# Patient Record
Sex: Female | Born: 1995 | Race: White | Hispanic: No | Marital: Single | Smoking: Never smoker
Health system: Southern US, Community
[De-identification: ages and names within clinical notes are randomized; demographics above are authoritative.]

## PROBLEM LIST (undated history)

## (undated) DIAGNOSIS — E282 Polycystic ovarian syndrome: Secondary | ICD-10-CM

## (undated) DIAGNOSIS — J302 Other seasonal allergic rhinitis: Secondary | ICD-10-CM

## (undated) DIAGNOSIS — I959 Hypotension, unspecified: Secondary | ICD-10-CM

## (undated) DIAGNOSIS — L309 Dermatitis, unspecified: Secondary | ICD-10-CM

## (undated) DIAGNOSIS — J342 Deviated nasal septum: Secondary | ICD-10-CM

## (undated) DIAGNOSIS — R112 Nausea with vomiting, unspecified: Secondary | ICD-10-CM

## (undated) DIAGNOSIS — Z8489 Family history of other specified conditions: Secondary | ICD-10-CM

## (undated) DIAGNOSIS — D899 Disorder involving the immune mechanism, unspecified: Secondary | ICD-10-CM

## (undated) DIAGNOSIS — B2790 Infectious mononucleosis, unspecified without complication: Secondary | ICD-10-CM

## (undated) DIAGNOSIS — F419 Anxiety disorder, unspecified: Secondary | ICD-10-CM

## (undated) DIAGNOSIS — R7303 Prediabetes: Secondary | ICD-10-CM

## (undated) DIAGNOSIS — I7300 Raynaud's syndrome without gangrene: Secondary | ICD-10-CM

## (undated) DIAGNOSIS — Z9889 Other specified postprocedural states: Secondary | ICD-10-CM

## (undated) HISTORY — DX: Infectious mononucleosis, unspecified without complication: B27.90

## (undated) HISTORY — DX: Other seasonal allergic rhinitis: J30.2

## (undated) HISTORY — DX: Polycystic ovarian syndrome: E28.2

---
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
## 2017-01-26 DIAGNOSIS — E162 Hypoglycemia, unspecified: Secondary | ICD-10-CM | POA: Diagnosis not present

## 2017-01-27 DIAGNOSIS — H1045 Other chronic allergic conjunctivitis: Secondary | ICD-10-CM | POA: Diagnosis not present

## 2017-01-27 DIAGNOSIS — L2089 Other atopic dermatitis: Secondary | ICD-10-CM | POA: Diagnosis not present

## 2017-01-27 DIAGNOSIS — J3089 Other allergic rhinitis: Secondary | ICD-10-CM | POA: Diagnosis not present

## 2017-01-27 DIAGNOSIS — J301 Allergic rhinitis due to pollen: Secondary | ICD-10-CM | POA: Diagnosis not present

## 2017-04-18 DIAGNOSIS — G43009 Migraine without aura, not intractable, without status migrainosus: Secondary | ICD-10-CM | POA: Diagnosis not present

## 2017-04-18 DIAGNOSIS — J3089 Other allergic rhinitis: Secondary | ICD-10-CM | POA: Insufficient documentation

## 2017-04-18 DIAGNOSIS — J310 Chronic rhinitis: Secondary | ICD-10-CM | POA: Diagnosis not present

## 2017-04-20 DIAGNOSIS — J329 Chronic sinusitis, unspecified: Secondary | ICD-10-CM | POA: Diagnosis not present

## 2017-04-20 DIAGNOSIS — J3089 Other allergic rhinitis: Secondary | ICD-10-CM | POA: Diagnosis not present

## 2017-04-20 DIAGNOSIS — G43009 Migraine without aura, not intractable, without status migrainosus: Secondary | ICD-10-CM | POA: Diagnosis not present

## 2017-04-20 DIAGNOSIS — Z0100 Encounter for examination of eyes and vision without abnormal findings: Secondary | ICD-10-CM | POA: Diagnosis not present

## 2017-06-20 DIAGNOSIS — J324 Chronic pansinusitis: Secondary | ICD-10-CM | POA: Diagnosis not present

## 2017-07-08 DIAGNOSIS — S0502XA Injury of conjunctiva and corneal abrasion without foreign body, left eye, initial encounter: Secondary | ICD-10-CM | POA: Diagnosis not present

## 2017-07-26 ENCOUNTER — Encounter: Payer: Self-pay | Admitting: Physician Assistant

## 2017-07-26 ENCOUNTER — Ambulatory Visit (INDEPENDENT_AMBULATORY_CARE_PROVIDER_SITE_OTHER): Payer: BLUE CROSS/BLUE SHIELD | Admitting: Physician Assistant

## 2017-07-26 VITALS — BP 90/62 | HR 96 | Ht 68.0 in | Wt 125.0 lb

## 2017-07-26 DIAGNOSIS — R1084 Generalized abdominal pain: Secondary | ICD-10-CM

## 2017-07-26 DIAGNOSIS — K589 Irritable bowel syndrome without diarrhea: Secondary | ICD-10-CM | POA: Diagnosis not present

## 2017-07-26 DIAGNOSIS — R194 Change in bowel habit: Secondary | ICD-10-CM

## 2017-07-26 NOTE — Progress Notes (Addendum)
Chief Complaint: Change in bowel habits, "loud swallowing", abdominal pain  HPI:    Terry Harmon is a 22 year old Caucasian female with a past medical history as listed below including PCOS, who was assigned to Dr. Leone Payor at last visit, who presents to clinic today with a complaint of change in bowel habits, "loud swallowing", and abdominal pain.    04/01/2016 office visit with me to discuss the bloating and reflux.  At that time IBS was discussed, H. pylori fecal antigen was ordered and patient was told to stay on her lansoprazole 15 mg daily.  H. pylori fecal antigen was negative.    Today, patient presents alone and describes that in November she came down with a sinus infection and was placed on antibiotics, this never went away and she was placed on more antibiotics in December.  Was using Doxycycline which was making her vomit or have diarrhea or both.  Continued with a sinus infection and was placed on a third antibiotic, "I think Augmentin" for about a month..  This did help her sinus infection and she was found to have a deviated septum which is going to be surgically fixed later.      Since December the patient has had irregular bowel movements describing more frequent stools, "my normal is 2 to 3 days apart", as well as a change in color to orange/yellow and often with mucus.  2 weeks ago the patient started a probiotic called Zenwyse and has had normal stools since then.  She questions what to do going forward.    Also describes that she thinks her lactose intolerance is getting better.  She is able to handle larger amounts of dairy with no problems.  Asked if there is any way to "wean herself off of Lactaid".    Describes "loud swallowing".  Apparently her family laugh at her when she is drinking as they can hear it in various rooms of the house.  She has always done this her whole life but her parents wanted to make sure this was okay.    Also discusses that she holds her anxiety and stress  in her abdomen at times will become tense in her stomach when this occurs and wonders if there are any stretches for this.    Social history positive for going to [employer].  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    Denies fever, chills, blood in her stool, melena, weight loss, anorexia or symptoms that awaken her at night.  Past Medical History:  Diagnosis Date  . 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 Mononucleosis    Age 64  . PCOS (polycystic ovarian syndrome)   . Seasonal allergies     Past Surgical History:  Procedure Laterality Date  . WISDOM TOOTH EXTRACTION  2018    Current Outpatient Medications  Medication Sig Dispense Refill  . cetirizine (ZYRTEC) 10 MG tablet Take 10 mg by mouth daily.    . fexofenadine (ALLEGRA) 180 MG tablet Take 180 mg by mouth daily.    . Lactobacillus (PROBIOTIC ACIDOPHILUS PO) Take 1 capsule by mouth daily.    . lansoprazole (PREVACID) 15 MG capsule Take 15 mg by mouth daily at 12 noon.    Marland Kitchen Lysine 500 MG TABS Take 1 tablet by mouth daily as needed.    . Multiple Vitamins-Minerals (WOMENS MULTIVITAMIN PLUS PO) Take 1 tablet by mouth  daily.    . Omega-3 Fatty Acids (FISH OIL) 1000 MG CAPS Take 1 capsule by mouth daily.    . vitamin E 100 UNIT capsule Take 100 Units by mouth daily.      No current facility-administered medications for this visit.     Allergies as of 07/26/2017 - Review Complete 07/26/2017  Allergen Reaction Noted  . Corn-containing products Dermatitis 02/26/2014  . Doxycycline Nausea And Vomiting 04/25/2017  . Molds & smuts Other (See Comments) 10/23/2013  . Tomato Nausea And Vomiting 10/04/2014    Family History  Problem Relation Age of Onset  . Diabetes Maternal Grandmother   . Cancer Maternal Grandmother   . Diabetes Maternal Grandfather   . Hypertension Paternal Grandmother   . Hypertension Paternal Grandfather   . Autoimmune  disease Maternal Aunt        Myastinia  . Autoimmune disease Sister     Social History   Socioeconomic History  . Marital status: Single    Spouse name: Not on file  . Number of children: Not on file  . Years of education: Not on file  . Highest education level: Not on file  Occupational History  . Not on file  Social Needs  . Financial resource strain: Not on file  . Food insecurity:    Worry: Not on file    Inability: Not on file  . Transportation needs:    Medical: Not on file    Non-medical: Not on file  Tobacco Use  . Smoking status: Never Smoker  . Smokeless tobacco: Never Used  Substance and Sexual Activity  . Alcohol use: No  . Drug use: No  . Sexual activity: Not on file  Lifestyle  . Physical activity:    Days per week: Not on file    Minutes per session: Not on file  . Stress: Not on file  Relationships  . Social connections:    Talks on phone: Not on file    Gets together: Not on file    Attends religious service: Not on file    Active member of club or organization: Not on file    Attends meetings of clubs or organizations: Not on file    Relationship status: Not on file  . Intimate partner violence:    Fear of current or ex partner: Not on file    Emotionally abused: Not on file    Physically abused: Not on file    Forced sexual activity: Not on file  Other Topics Concern  . Not on file  Social History Narrative   Lives at home with mom dad and three siblings, attends [employer] is in 10th grade.     Review of Systems:    Constitutional: No weight loss, fever or chills Cardiovascular: No chest pain Respiratory: No SOB  Gastrointestinal: See HPI and otherwise negative   Physical Exam:  Vital signs: BP 90/62   Pulse 96   Ht 5\' 8"  (1.727 m)   Wt 125 lb (56.7 kg)   LMP 07/18/2017 (Approximate)   BMI 19.01 kg/m   Constitutional:   Pleasant Caucasian female appears to be in NAD, Well developed, Well nourished, alert and  cooperative Respiratory: Respirations even and unlabored. Lungs clear to auscultation bilaterally.   No wheezes, crackles, or rhonchi.  Cardiovascular: Normal S1, S2. No MRG. Regular rate and rhythm. No peripheral edema, cyanosis or pallor.  Gastrointestinal:  Soft, nondistended, nontender. No rebound or guarding. Normal bowel sounds. No appreciable masses or hepatomegaly.  Psychiatric: Demonstrates good judgement and reason without abnormal affect or behaviors.  No recent labs or imaging.  Assessment: 1.  Change in bowel habits: Describes mucus and change in color after multiple antibiotics for sinusitis, better now over the past 2 weeks with a probiotic 2.  "Loud swallowing": Possibly aerophagia, discussed avoiding taking sips from straws and possibly the chin tuck technique to help 3.  Abdominal pain: Typically related to stress and anxiety, related to IBS  Plan: 1.  Again reviewed IBS.  At this time recommend she continue her probiotic for another 6 to 8 weeks.  Discussed that she should be able to stop this then.  If she continues with change in bowel habits after that time she can continue it indefinitely if needed. 2.  Discussed the chin tuck technique when she is taking sips of water to see if this helps at all with her loud swallowing.  Reassured her that there is no cause for concern over this symptom. 3.  Explained that the patient may want to find a way to decrease stress and anxiety in her life such as yoga or another soothing activity/outlet for the stress she holds in her abdomen. 4.  Explained to the patient that she could try to gradually increase amounts of lactose she is consuming at meals to see what she is able to handle.  Otherwise she needs to continue Lactaid if she knows she is going to eat too much dairy. 5.  Patient to follow in clinic with me or Dr. Leone Payor as needed in the future.  Hyacinth Meeker, PA-C [facility] 07/26/2017, 2:28 PM  Cc: Ronney Asters, MD   Agree with Terry Harmon's evaluation and management.  Iva Boop, MD, [facility]

## 2017-07-26 NOTE — Patient Instructions (Signed)
If you are age 20 or older, your body mass index should be between 23-30. Your Body mass index is 19.01 kg/m. If this is out of the aforementioned range listed, please consider follow up with your Primary Care Provider.  If you are age 20 or younger, your body mass index should be between 19-25. Your Body mass index is 19.01 kg/m. If this is out of the aformentioned range listed, please consider follow up with your Primary Care Provider.   Continue Pre/probiotic for 2 months total.  Try chin-tuck to decrease swallow noise.  Try slowly increasing the amount of dairy you are eating at one time.  Terry Harmon with college! 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  Thank you for choosing me and [facility].  Hyacinth MeekerJennifer Lemmon, PA-C

## 2017-08-04 IMAGING — US US PELVIS COMPLETE
1 series · 14 of 25 positions shown · non-contrast
Comparison: None.

CLINICAL DATA: Menstrual irregularity. Polycystic ovarian syndrome.

EXAM:
TRANSABDOMINAL ULTRASOUND OF PELVIS
TECHNIQUE: Transabdominal ultrasound examination of the pelvis was performed
including evaluation of the uterus, ovaries, adnexal regions, and
pelvic cul-de-sac.

[Series 1: us pelvis complete · 0.17mm/px · 14 of 31 slices shown]
[im 1/31]
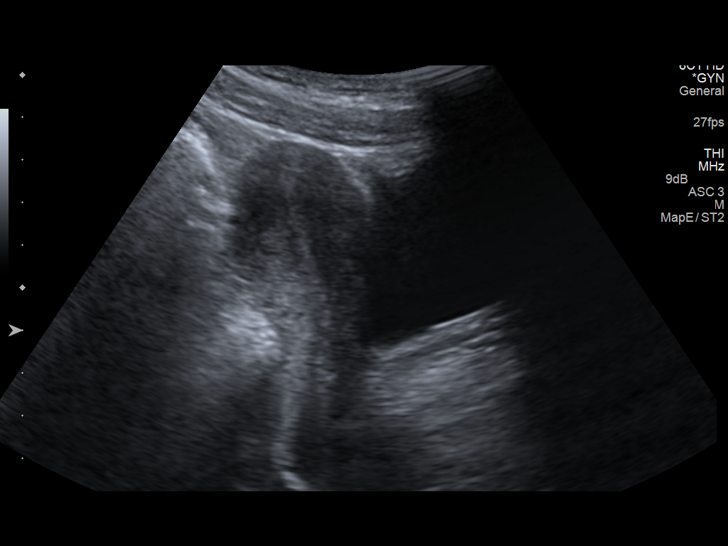
[im 3/31]
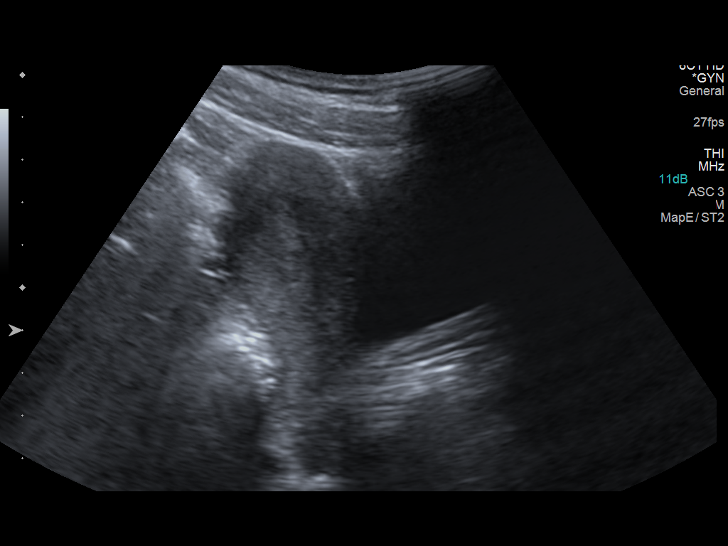
[im 6/31]
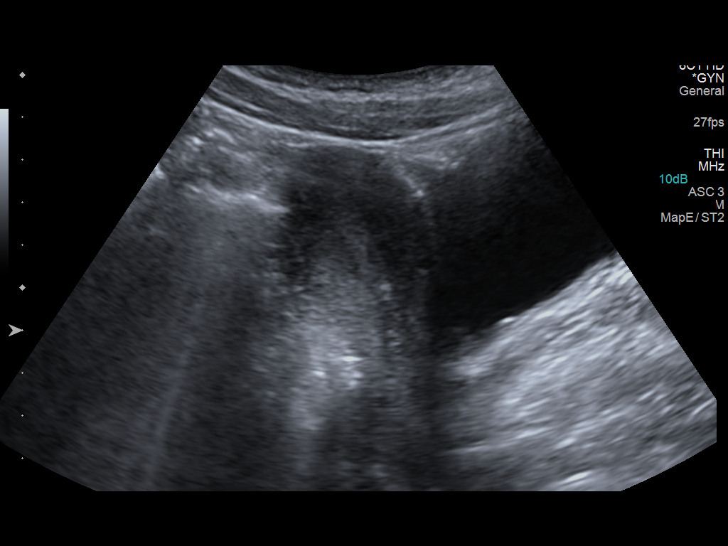
[im 8/31]
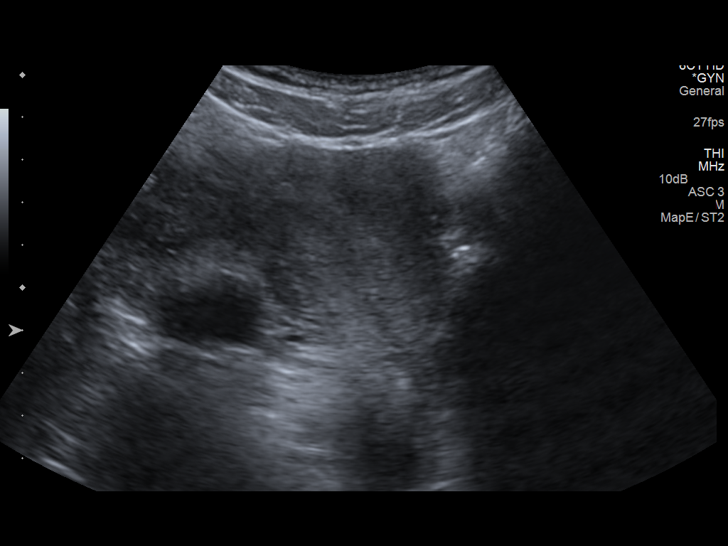
[im 11/31]
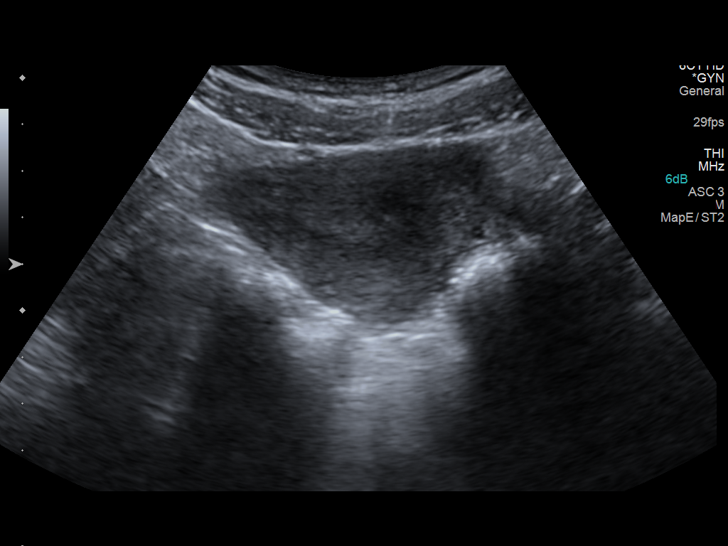
[im 12/31]
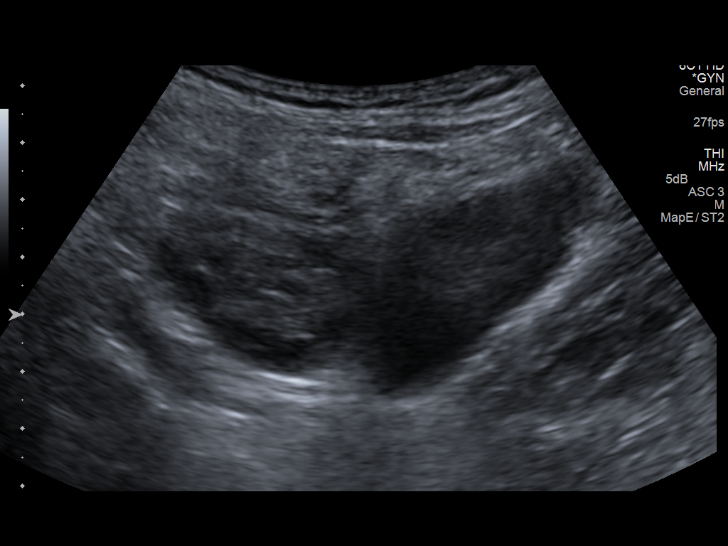
[im 14/31]
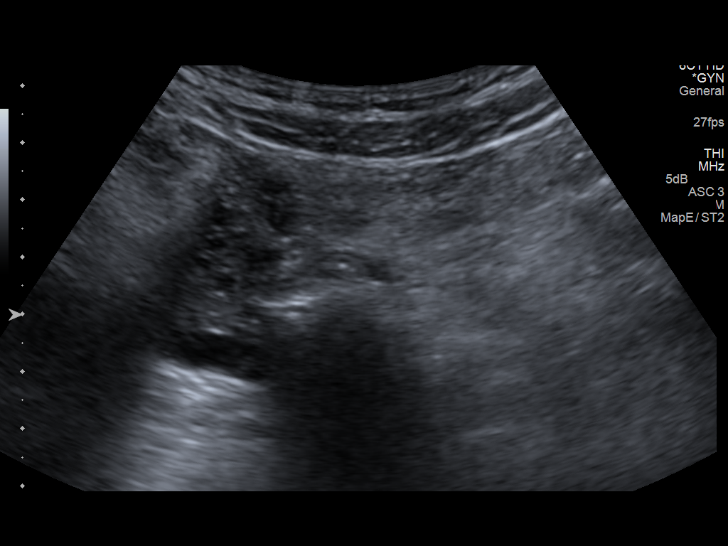
[im 17/31]
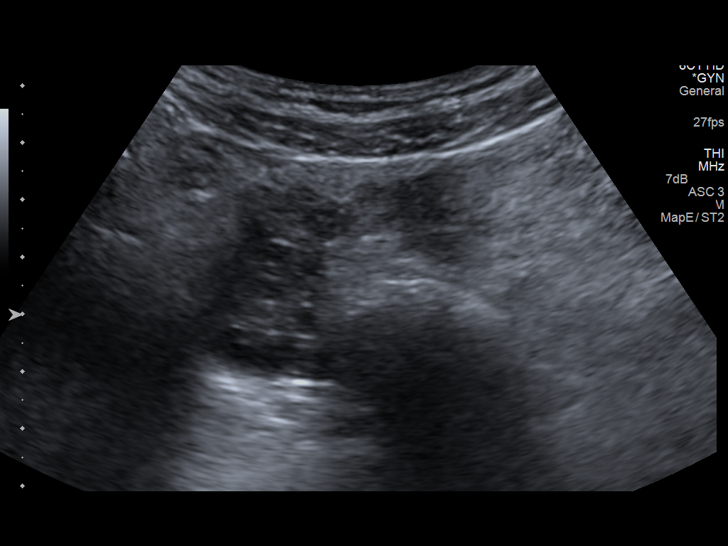
[im 19/31]
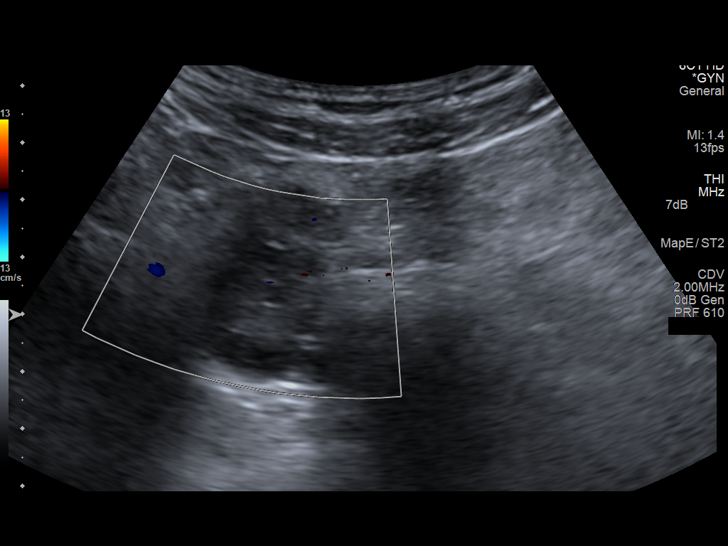
[im 21/31]
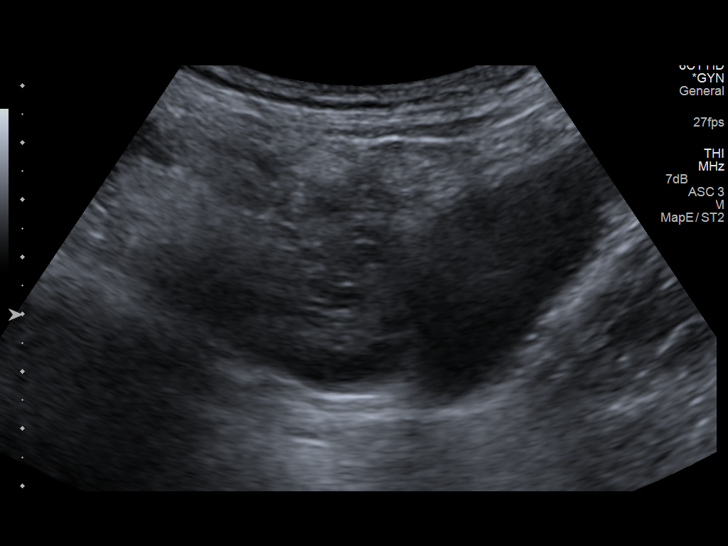
[im 23/31]
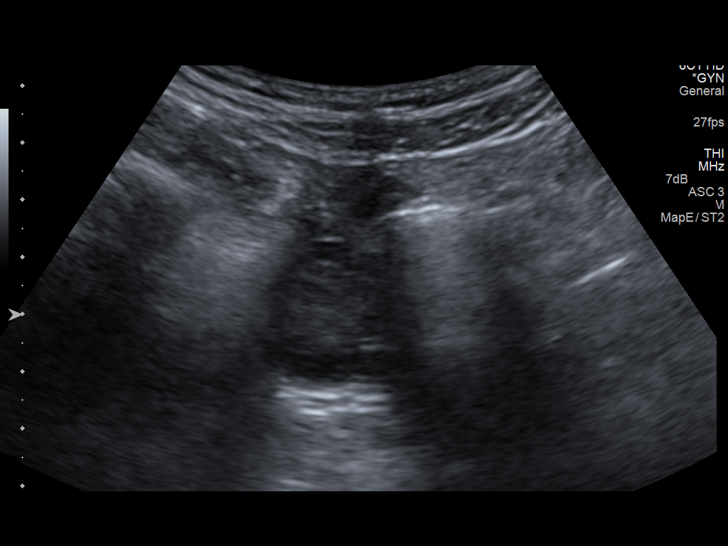
[im 26/31]
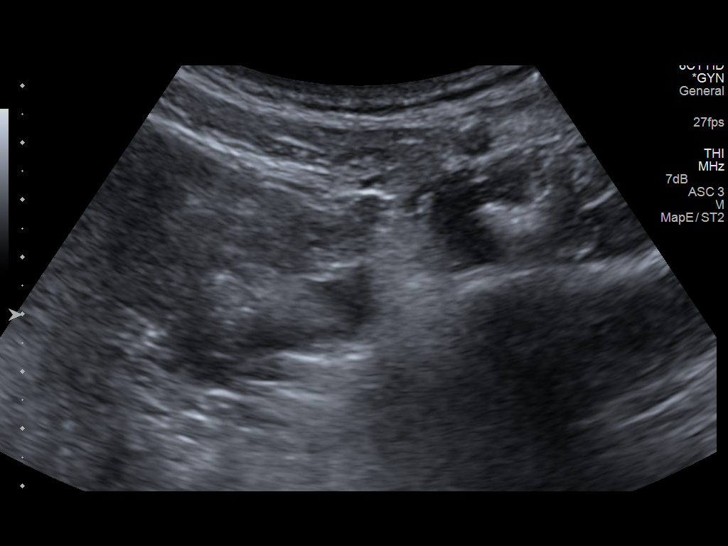
[im 28/31]
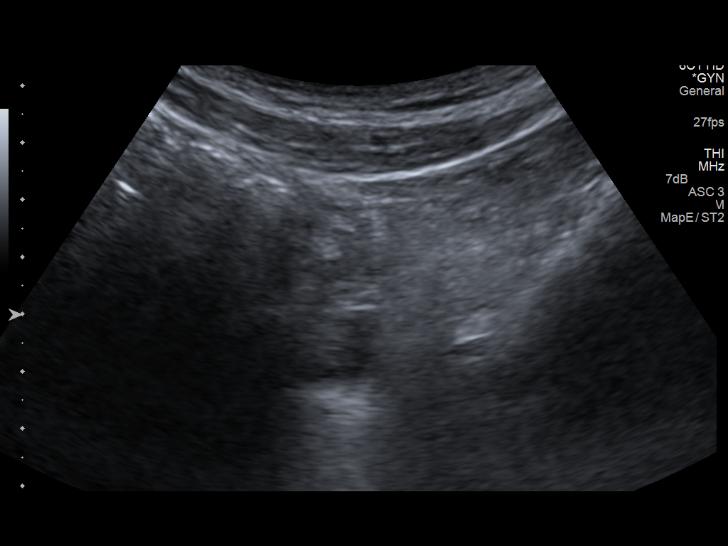
[im 31/31]
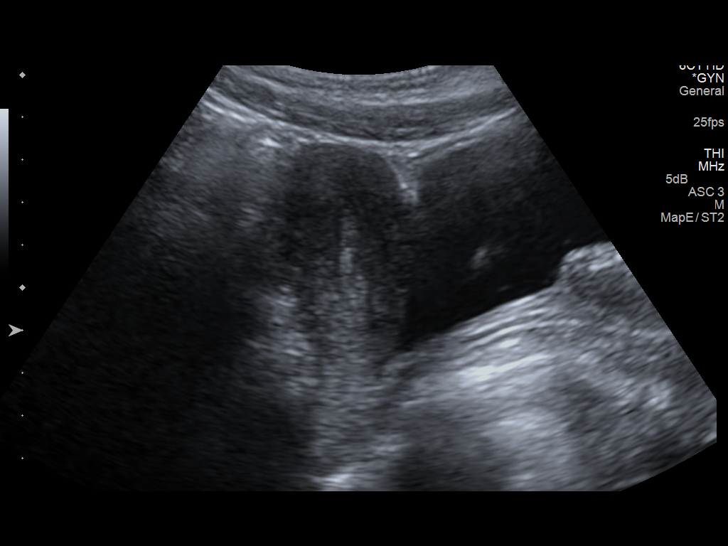

[14 of 25 positions shown; findings below may reference images not displayed]

FINDINGS: Uterus

Measurements: 8.6 x 3.4 x 5.0 cm, within normal limits. No fibroids
or other mass visualized.

Endometrium

Thickness: 8.4 mm, within normal limits. No focal abnormality
visualized.

Right ovary

Measurements: 3.8 x 2.1 x 2.0 cm, within normal limits. Normal
appearance/no adnexal mass.

Left ovary

The left ovary is not discretely visualized. No adnexal mass is
present.

Other findings:  No free fluid
IMPRESSION: 1. Normal appearance of the uterus.
2. Normal appearance of the right ovary without evidence for
significant cystic change.
3. The left ovary is not discretely visualized.

## 2017-08-10 DIAGNOSIS — F4323 Adjustment disorder with mixed anxiety and depressed mood: Secondary | ICD-10-CM | POA: Diagnosis not present

## 2017-08-14 ENCOUNTER — Encounter: Payer: Self-pay | Admitting: Physician Assistant

## 2017-08-22 DIAGNOSIS — E282 Polycystic ovarian syndrome: Secondary | ICD-10-CM | POA: Diagnosis not present

## 2017-08-22 DIAGNOSIS — E162 Hypoglycemia, unspecified: Secondary | ICD-10-CM | POA: Diagnosis not present

## 2017-08-23 ENCOUNTER — Ambulatory Visit (INDEPENDENT_AMBULATORY_CARE_PROVIDER_SITE_OTHER): Payer: BLUE CROSS/BLUE SHIELD | Admitting: Orthopaedic Surgery

## 2017-08-23 ENCOUNTER — Encounter (INDEPENDENT_AMBULATORY_CARE_PROVIDER_SITE_OTHER): Payer: Self-pay | Admitting: Orthopaedic Surgery

## 2017-08-23 VITALS — Ht 69.0 in | Wt 125.0 lb

## 2017-08-23 DIAGNOSIS — M25551 Pain in right hip: Secondary | ICD-10-CM | POA: Diagnosis not present

## 2017-08-23 DIAGNOSIS — M25552 Pain in left hip: Secondary | ICD-10-CM | POA: Diagnosis not present

## 2017-08-23 NOTE — Progress Notes (Addendum)
Office Visit Note   Patient: Terry Harmon           Date of Birth: Jan 18, 1996           MRN: [id number] Visit Date: 08/23/2017              Requested by: Terry AstersSummer, [name], MD [address] [place], [place] [id number] PCP: Terry AstersSummer, [name], MD   Assessment & Plan: Visit Diagnoses:  1. 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  Beighton Horan scale is 7/9.  At this point, we will send the patient to formal physical therapy for this.  A prescription was given to her today.  She will follow-up with us as needed.    Follow-Up Instructions: Return if symptoms worsen or fail to improve.   Orders:  No orders of the defined types were placed in this encounter.  No orders of the defined types were placed in this encounter.     Procedures: No procedures performed   Clinical Data: No additional findings.   Subjective: Chief Complaint  Patient presents with  . Left Hip - Pain  . Right Hip - Pain    HPI patient is a pleasant [age] year old female who presents to our clinic today with bilateral hip pain.  This is been ongoing for the past several years without any known injury or change in activity.  It has progressively worsened over the past year she has been away at school and it has been participating in provoking activities such as frequently crossing her legs while externally rotating the hips.  She also notes subluxation of both hips.  She was recently seen by chiropractor where she was diagnosed with a left psoas strain.  Both hips were adjusted which has somewhat improved her symptoms.  She has not tried formal physical therapy.  Of note, she is hypermobile.  She has recently had x-rays done by her chiropractor.  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  Review of Systems as detailed in HPI.  All others reviewed and are  negative.   Objective: Vital Signs: Ht 5\' 9"  (1.753 m)   Wt 125 lb (56.7 kg)   BMI 18.46 kg/m   Physical Exam well-developed and well-nourished female in no acute distress.  Alert and oriented x3.  Ortho Exam examination of both hips reveals increased mobility with internal rotation of both hips.  This does elicit pain on the right more so than the left.  Negative Faber.  Full motion and strength with hip flexion, abduction and abduction.  She does have increased pain with resisted hip flexion.  She has a Beight score of 7/9.  Specialty Comments:  No specialty comments available.  Imaging: No new imaging   PMFS History: Patient Active Problem List   Diagnosis Date Noted  . Pain of both hip joints 08/23/2017  . Abnormal blood sugar 11/29/2014  . Irregular menstrual cycle 02/26/2014  . Multiple environmental allergies 11/04/2013  . ECZEMA, ATOPIC DERMATITIS 04/07/2006   Past Medical History:  Diagnosis Date  . 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 Mononucleosis    Age 22  . PCOS (polycystic ovarian syndrome)   . Seasonal allergies     Family History  Problem Relation Age of Onset  . Diabetes Maternal Grandmother   . Cancer Maternal Grandmother   .  Diabetes Maternal Grandfather   . Hypertension Paternal Grandmother   . Hypertension Paternal Grandfather   . Autoimmune disease Maternal Aunt        Myastinia  . Autoimmune disease Sister     Past Surgical History:  Procedure Laterality Date  . WISDOM TOOTH EXTRACTION  2018   Social History   Occupational History  . Not on file  Tobacco Use  . Smoking status: Never Smoker  . Smokeless tobacco: Never Used  Substance and Sexual Activity  . Alcohol use: No  . Drug use: No  . Sexual activity: Not on file

## 2017-08-24 DIAGNOSIS — F4323 Adjustment disorder with mixed anxiety and depressed mood: Secondary | ICD-10-CM | POA: Diagnosis not present

## 2017-08-25 DIAGNOSIS — I959 Hypotension, unspecified: Secondary | ICD-10-CM | POA: Diagnosis not present

## 2017-08-25 DIAGNOSIS — E162 Hypoglycemia, unspecified: Secondary | ICD-10-CM | POA: Diagnosis not present

## 2017-08-25 DIAGNOSIS — E282 Polycystic ovarian syndrome: Secondary | ICD-10-CM | POA: Diagnosis not present

## 2017-08-31 ENCOUNTER — Ambulatory Visit: Payer: BLUE CROSS/BLUE SHIELD | Attending: Orthopaedic Surgery | Admitting: Physical Therapy

## 2017-08-31 ENCOUNTER — Other Ambulatory Visit: Payer: Self-pay

## 2017-08-31 ENCOUNTER — Encounter: Payer: Self-pay | Admitting: Physical Therapy

## 2017-08-31 DIAGNOSIS — M25551 Pain in right hip: Secondary | ICD-10-CM | POA: Insufficient documentation

## 2017-08-31 DIAGNOSIS — M6281 Muscle weakness (generalized): Secondary | ICD-10-CM

## 2017-08-31 DIAGNOSIS — M25552 Pain in left hip: Secondary | ICD-10-CM | POA: Diagnosis not present

## 2017-08-31 DIAGNOSIS — F4323 Adjustment disorder with mixed anxiety and depressed mood: Secondary | ICD-10-CM | POA: Diagnosis not present

## 2017-08-31 DIAGNOSIS — R262 Difficulty in walking, not elsewhere classified: Secondary | ICD-10-CM | POA: Diagnosis not present

## 2017-08-31 NOTE — Therapy (Signed)
[facility] [address] [place], [place], [address] Phone: [phone]   Fax:  [phone]  Physical Therapy Evaluation  Patient Details  Name: Terry Harmon MRN: [id number] Date of Birth: [birth year] Referring Provider: Gershon Mussel, MD   Encounter Date: 08/31/2017  PT End of Session - 08/31/17 1427    Visit Number  1    Number of Visits  12    PT Start Time  1145    PT Stop Time  1232    PT Time Calculation (min)  47 min    Activity Tolerance  Patient tolerated treatment well    Behavior During Therapy  [facility] for tasks assessed/performed       Past Medical History:  Diagnosis Date  . 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 Mononucleosis    Age 22  . PCOS (polycystic ovarian syndrome)   . Seasonal allergies     Past Surgical History:  Procedure Laterality Date  . WISDOM TOOTH EXTRACTION  2018    There were no vitals filed for this visit.   Subjective Assessment - 08/31/17 1155    Subjective  Pt arriving to therapy with bilateral hip pain. Pt reporting bilateral "popping" where she thinks is when her hips are subluxing. Pt reporting the R is worse than left. Pt reporting having PT when she was in middle school. Pt reporting having bilateral orthotics to support her arches to prevent pronation/dropped arch. Pt also reported that since she began college and stopped playing sports regularly her pain has worsened.     Pertinent History  Snapping hip syndrome L, extra lumbar vertebrae    Limitations  Sitting    How long can you sit comfortably?  unable to sit comfortably, pt reports having to constantly shift her weight. Pt reports sitting with knee and hips flexed is more comfortable.    How long can you stand comfortably?  1-2 hours    How long can you walk comfortably?  unlimited    Diagnostic tests  X-rays, 2018    Patient Stated Goals  Stop hurting, establish a good exercise program  to prevent flare ups    Currently in Pain?  Yes    Pain Score  2     Pain Location  Hip    Pain Orientation  Right;Left    Pain Descriptors / Indicators  Aching;Discomfort;Sore    Pain Type  Chronic pain    Pain Radiating Towards  lower back    Pain Onset  More than a month ago    Pain Frequency  Constant    Aggravating Factors   certain movements    Pain Relieving Factors  going to chiropractor         [facility] PT Assessment - 08/31/17 0001      Assessment   Medical Diagnosis  bilateral hip pain, ligament laxity history of hip subluxation    Referring Provider  Gershon Mussel, MD    Hand Dominance  Right    Prior Therapy  no      Precautions   Precautions  None    Required Braces or Orthoses  -- bilateral foot orthotics      Restrictions   Weight Bearing Restrictions  No      Balance Screen   Has the patient fallen in the past 6 months  No    Is the patient reluctant to leave their home because of a fear  of falling?   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[employer], bikes to class    Leisure  frisbee, swimming      Cognition   Overall Cognitive Status  Within Functional Limits for tasks assessed      Observation/Other Assessments   Focus on Therapeutic Outcomes (FOTO)   60% limitation      ROM / Strength   AROM / PROM / Strength  AROM;Strength      AROM   Overall AROM Comments  bilateral hip's WNL      Strength   Overall Strength Comments  painful MMT in bilateral hips with flexion and abduction    Strength Assessment Site  Hip    Right/Left Hip  Right;Left    Right Hip Flexion  4/5    Right Hip Extension  4/5    Right Hip ABduction  3+/5    Right Hip ADduction  4-/5    Left Hip Flexion  4/5    Left Hip Extension  4/5    Left Hip ABduction  3+/5    Left Hip ADduction  4-/5      Ambulation/Gait   Gait Pattern  Within Functional Limits      High Level Balance   High Level  Balance Comments  SLS with bilateral trendelenburg                 Objective measurements completed on examination: See above findings.              PT Education - 08/31/17 1426    Education Details  HEP, purpose and plan for PT    Person(s) Educated  Patient    Methods  Explanation;Handout;Verbal cues    Comprehension  Verbalized understanding;Returned demonstration          PT Long Term Goals - 08/31/17 1445      PT LONG TERM GOAL #1   Title  Pt will be independent in her HEP and progression.     Time  4    Period  Weeks    Status  New    Target Date  09/28/17      PT LONG TERM GOAL #2   Title  Pt will be able to perform SLS activities with no trendelenburg present.     Time  4    Period  Weeks    Status  New    Target Date  09/28/17      PT LONG TERM GOAL #3   Title  Pt will improve her hip abduction/adduction to >/= 4+/5 in order to improve functional mobility.     Time  4    Period  Weeks    Status  New    Target Date  09/28/17      PT LONG TERM GOAL #4   Title  Pt will improve her FOTO score from 60% limitation to </= 50% limitaiton before returning to school.     Time  4    Period  Weeks    Status  New    Target Date  09/28/17             Plan - 08/31/17 1435    Clinical Impression Statement  Pt arriving to therapy reporting history of bilateral hip subluxation. Pt reporting bilateral hip pain and diffictuly with certain recreational activities. Pt currently going to  chrioprator  where she received dx of psoas strain on L. Pt with Beighton score of 7/9 and extra lumbar vertebra. Pt also reporting history of low back pain. Pt is leaving for college on 09/23/17. Skilled PT needed to progress pt with the below interventions.     History and Personal Factors relevant to plan of care:  Beighton Score 7/9, h/o bilateral hip subluxation    Clinical Presentation  Stable    Clinical Decision Making  Low    Rehab Potential  Good    PT  Frequency  3x / week    PT Duration  4 weeks    PT Treatment/Interventions  Electrical Stimulation;Moist Heat;Balance training;Therapeutic exercise;Therapeutic activities;Functional mobility training;Gait training;Stair training;Neuromuscular re-education;Patient/family education;Manual techniques;Passive range of motion;Taping;Dry needling    PT Next Visit Plan  hip stability exercises, core stablity, STW to lumbar spine and hips, SLS stability    PT Home Exercise Plan  SLR posture alignment correction while looking in mirror    Consulted and Agree with Plan of Care  Patient       Patient will benefit from skilled therapeutic intervention in order to improve the following deficits and impairments:  Pain, Postural dysfunction, Difficulty walking, Hypermobility, Decreased activity tolerance, Decreased strength  Visit Diagnosis: Pain in left hip - Plan: PT plan of care cert/re-cert  Pain in right hip - Plan: PT plan of care cert/re-cert  Difficulty in walking, not elsewhere classified - Plan: PT plan of care cert/re-cert  Muscle weakness (generalized) - Plan: PT plan of care cert/re-cert     Problem List Patient Active Problem List   Diagnosis Date Noted  . Pain of both hip joints 08/23/2017  . Abnormal blood sugar 11/29/2014  . Irregular menstrual cycle 02/26/2014  . Multiple environmental allergies 11/04/2013  . ECZEMA, ATOPIC DERMATITIS 04/07/2006    Sharmon Leyden, MPT 08/31/2017, 2:52 PM  [facility] [address] [place], [place], [address] Phone: [phone]   Fax:  [phone]  Name: KENNIDI YOSHIDA MRN: [id number] Date of Birth: 1995/07/26

## 2017-09-02 ENCOUNTER — Ambulatory Visit: Payer: BLUE CROSS/BLUE SHIELD | Admitting: Physical Therapy

## 2017-09-02 DIAGNOSIS — R262 Difficulty in walking, not elsewhere classified: Secondary | ICD-10-CM

## 2017-09-02 DIAGNOSIS — M6281 Muscle weakness (generalized): Secondary | ICD-10-CM | POA: Diagnosis not present

## 2017-09-02 DIAGNOSIS — M25552 Pain in left hip: Secondary | ICD-10-CM

## 2017-09-02 DIAGNOSIS — M25551 Pain in right hip: Secondary | ICD-10-CM | POA: Diagnosis not present

## 2017-09-02 NOTE — Patient Instructions (Signed)
Access Code: [email or web address]  URL: [email or web address]  Date: 09/02/2017  Prepared by: Karie MainlandJennifer Kaylon Hitz   Exercises  Supine Bridge - 10 reps - 2 sets - 5 hold - 1x daily - 7x weekly  Clamshell with Resistance - 10 reps - 2 sets - 5 hold - 1x daily - 7x weekly  Sidelying Hip Abduction - 10 reps - 2 sets - 5 hold - 1x daily - 7x weekly

## 2017-09-02 NOTE — Therapy (Signed)
[place], [place], [address] Phone: [phone]   Fax:  [phone]  Physical Therapy Treatment  Patient Details  Name: Terry Harmon MRN: [id number] Date of Birth: [birth year] Referring Provider: Frankey Shown, MD   Encounter Date: 09/02/2017  PT End of Session - 09/02/17 0952    Visit Number  2    Number of Visits  12    PT Start Time  0940    PT Stop Time  1025    PT Time Calculation (min)  45 min    Activity Tolerance  Patient tolerated treatment well    Behavior During Therapy  [facility] for tasks assessed/performed       Past Medical History:  Diagnosis Date  . 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 Mononucleosis    Age 22  . PCOS (polycystic ovarian syndrome)   . Seasonal allergies     Past Surgical History:  Procedure Laterality Date  . WISDOM TOOTH EXTRACTION  2018    There were no vitals filed for this visit.  Subjective Assessment - 09/02/17 0942    Subjective  Bilateral hips uncomfortable, low back is uncomfortable too.  Back packed yesterday for several hours and did surprisingly well.      Currently in Pain?  Yes    Pain Score  1     Pain Location  Back and hips     Pain Orientation  Right;Left;Lower    Pain Descriptors / Indicators  Discomfort    Pain Type  Chronic pain    Pain Onset  More than a month ago    Pain Frequency  Constant           [facility] Adult PT Treatment/Exercise - 09/02/17 0001      Self-Care   Self-Care  Posture;Other Self-Care Comments    Posture  HEP, stab concepts     Other Self-Care Comments   pillows in sidelying for hip support       Lumbar Exercises: Supine   Pelvic Tilt  5 reps    Clam  20 reps    Clam Limitations  used ball for challenge (under pelvis)     Bent Knee Raise  20 reps    Bridge  10 reps    Bridge with [employer]  10 reps    Bridge with [employer] Limitations  2 sets variations     Bridge with clamshell  10 reps    Bridge with [employer] Limitations  2 sets 1 set unilateral       Knee/Hip Exercises: Sidelying   Hip ABduction  Strengthening;Both;1 set;10 reps    Hip ABduction Limitations  2 sets     Clams  x 10 x 2 sets blue band       Modalities   Modalities  Moist Heat      Moist Heat Therapy   Number Minutes Moist Heat  10 Minutes    Moist Heat Location  Lumbar Spine             PT Education - 09/02/17 1235    Education Details  stabilization concepts, HEP     Person(s) Educated  Patient    Methods  Explanation;Demonstration;Tactile cues;Verbal cues    Comprehension  Verbalized understanding;Need further instruction;Verbal cues required          PT Long Term Goals - 08/31/17 1445      PT LONG  TERM GOAL #1   Title  Pt will be independent in her HEP and progression.     Time  4    Period  Weeks    Status  New    Target Date  09/28/17      PT LONG TERM GOAL #2   Title  Pt will be able to perform SLS activities with no trendelenburg present.     Time  4    Period  Weeks    Status  New    Target Date  09/28/17      PT LONG TERM GOAL #3   Title  Pt will improve her hip abduction/adduction to >/= 4+/5 in order to improve functional mobility.     Time  4    Period  Weeks    Status  New    Target Date  09/28/17      PT LONG TERM GOAL #4   Title  Pt will improve her FOTO score from 60% limitation to </= 50% limitaiton before returning to school.     Time  4    Period  Weeks    Status  New    Target Date  09/28/17            Plan - 09/02/17 1236    Clinical Impression Statement  Patient did well with stabilization exercises, needed mod cues for technique. Able to correct [place] in standing.  Answered questions about sleeping, positioning. No goals met     PT Treatment/Interventions  Electrical Stimulation;Moist Heat;Balance training;Therapeutic exercise;Therapeutic activities;Functional mobility training;Gait  training;Stair training;Neuromuscular re-education;Patient/family education;Manual techniques;Passive range of motion;Taping;Dry needling    PT Next Visit Plan  hip stability exercises, core stablity, STW to lumbar spine and hips, SLS stability    PT Home Exercise Plan  SLR posture alignment correction while looking in mirror, bridging, clam , SL abd and SL clam with band     Consulted and Agree with Plan of Care  Patient       Patient will benefit from skilled therapeutic intervention in order to improve the following deficits and impairments:  Pain, Postural dysfunction, Difficulty walking, Hypermobility, Decreased activity tolerance, Decreased strength  Visit Diagnosis: Pain in left hip  Pain in right hip  Difficulty in walking, not elsewhere classified  Muscle weakness (generalized)     Problem List Patient Active Problem List   Diagnosis Date Noted  . Pain of both hip joints 08/23/2017  . Abnormal blood sugar 11/29/2014  . Irregular menstrual cycle 02/26/2014  . Multiple environmental allergies 11/04/2013  . ECZEMA, ATOPIC DERMATITIS 04/07/2006    Terry Harmon 09/02/2017, 12:39 PM  [facility] [address] [place], [place], [address] Phone: [phone]   Fax:  [phone]  Name: Terry Harmon MRN: [id number] Date of Birth: 05-13-1995  Raeford Razor, PT 09/02/17 12:39 PM Phone: [phone] Fax: [phone]

## 2017-09-13 ENCOUNTER — Encounter: Payer: Self-pay | Admitting: Physical Therapy

## 2017-09-13 ENCOUNTER — Ambulatory Visit: Payer: BLUE CROSS/BLUE SHIELD | Attending: Orthopaedic Surgery | Admitting: Physical Therapy

## 2017-09-13 DIAGNOSIS — M25552 Pain in left hip: Secondary | ICD-10-CM

## 2017-09-13 DIAGNOSIS — M25551 Pain in right hip: Secondary | ICD-10-CM | POA: Diagnosis not present

## 2017-09-13 DIAGNOSIS — M6281 Muscle weakness (generalized): Secondary | ICD-10-CM | POA: Diagnosis not present

## 2017-09-13 DIAGNOSIS — R262 Difficulty in walking, not elsewhere classified: Secondary | ICD-10-CM

## 2017-09-13 NOTE — Therapy (Signed)
[place], [place], [address] Phone: [phone]   Fax:  [phone]  Physical Therapy Treatment  Patient Details  Name: Terry Harmon MRN: [id number] Date of Birth: 12-13-1995 Referring Provider: Frankey Shown, MD   Encounter Date: 09/13/2017  PT End of Session - 09/13/17 1110    Visit Number  3    Number of Visits  12    PT Start Time  1110    PT Stop Time  1159    PT Time Calculation (min)  49 min    Activity Tolerance  Patient tolerated treatment well       Past Medical History:  Diagnosis Date  . 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 Mononucleosis    Age 22  . PCOS (polycystic ovarian syndrome)   . Seasonal allergies     Past Surgical History:  Procedure Laterality Date  . WISDOM TOOTH EXTRACTION  2018    There were no vitals filed for this visit.  Subjective Assessment - 09/13/17 1110    Subjective  Pt reports she saw the chiropractor and he was surprised at how much she has improved     Patient Stated Goals  Stop hurting, establish a good exercise program to prevent flare ups    Currently in Pain?  No/denies         [facility] PT Assessment - 09/13/17 0001      Strength   Right Hip ABduction  4+/5    Left Hip ABduction  4/5                   [facility] Adult PT Treatment/Exercise - 09/13/17 0001      Exercises   Exercises  Lumbar      Lumbar Exercises: Stretches   Piriformis Stretch  Left;Right;30 seconds      Lumbar Exercises: Aerobic   Stationary Bike  L2x5' with therapist present discussing progress      Lumbar Exercises: Standing   Other Standing Lumbar Exercises  SLS with FWD lean holding resistance out on blue band in hands      Lumbar Exercises: Supine   Single Leg Bridge  10 reps in figure 4    Other Supine Lumbar Exercises  15 reps oblique work with LTR, VC for form      Lumbar Exercises: Sidelying   Other Sidelying  Lumbar Exercises  10 reps each pilates FWD/BWD kicks, CW/CCW circles, FWD/BWD taps each side      Lumbar Exercises: Prone   Other Prone Lumbar Exercises  15 reps pelvic press with bilat hip ext, VC for form      Lumbar Exercises: Quadruped   [place]  10 reps      Modalities   Modalities  Moist Heat      Moist Heat Therapy   Number Minutes Moist Heat  10 Minutes    Moist Heat Location  Lumbar Spine                  PT Long Term Goals - 09/13/17 1114      PT LONG TERM GOAL #1   Title  Pt will be independent in her HEP and progression.     Status  On-going      PT LONG TERM GOAL #2   Title  Pt will be able to perform SLS activities with no trendelenburg present.     Status  On-going  PT LONG TERM GOAL #3   Title  Pt will improve her hip abduction/adduction to >/= 4+/5 in order to improve functional mobility.     Status  Partially Met      PT LONG TERM GOAL #4   Title  Pt will improve her FOTO score from 60% limitation to </= 50% limitaiton before returning to school.     Status  On-going            Plan - 09/13/17 1152    Clinical Impression Statement  Terry Harmon notes decrease in pain and improvement in strength/activity tolerance.  She has partailly met her strength goal.  Hip abduction fatigued with exercise today and she required VC to correct form.  She will be in town this week and next then returning to school.  Wishes to have a good HEP at that time.     Rehab Potential  Good    PT Frequency  3x / week    PT Duration  4 weeks    PT Treatment/Interventions  Electrical Stimulation;Moist Heat;Balance training;Therapeutic exercise;Therapeutic activities;Functional mobility training;Gait training;Stair training;Neuromuscular re-education;Patient/family education;Manual techniques;Passive range of motion;Taping;Dry needling    PT Next Visit Plan  pt will be seen this week and once next then returing to school, cont to progress hip and core strength.      Consulted and Agree with Plan of Care  Patient       Patient will benefit from skilled therapeutic intervention in order to improve the following deficits and impairments:  Pain, Postural dysfunction, Difficulty walking, Hypermobility, Decreased activity tolerance, Decreased strength  Visit Diagnosis: Pain in left hip  Pain in right hip  Difficulty in walking, not elsewhere classified  Muscle weakness (generalized)     Problem List Patient Active Problem List   Diagnosis Date Noted  . Pain of both hip joints 08/23/2017  . Abnormal blood sugar 11/29/2014  . Irregular menstrual cycle 02/26/2014  . Multiple environmental allergies 11/04/2013  . ECZEMA, ATOPIC DERMATITIS 04/07/2006    Terry Harmon PT  09/13/2017, 11:57 AM  [facility] [address] [place], [place], [address] Phone: [phone]   Fax:  [phone]  Name: Terry Harmon MRN: [id number] Date of Birth: 12-Jul-1995

## 2017-09-14 ENCOUNTER — Ambulatory Visit: Payer: BLUE CROSS/BLUE SHIELD | Admitting: Physical Therapy

## 2017-09-14 ENCOUNTER — Encounter: Payer: Self-pay | Admitting: Physical Therapy

## 2017-09-14 DIAGNOSIS — M25552 Pain in left hip: Secondary | ICD-10-CM

## 2017-09-14 DIAGNOSIS — M25551 Pain in right hip: Secondary | ICD-10-CM | POA: Diagnosis not present

## 2017-09-14 DIAGNOSIS — F4323 Adjustment disorder with mixed anxiety and depressed mood: Secondary | ICD-10-CM | POA: Diagnosis not present

## 2017-09-14 DIAGNOSIS — R262 Difficulty in walking, not elsewhere classified: Secondary | ICD-10-CM | POA: Diagnosis not present

## 2017-09-14 DIAGNOSIS — M6281 Muscle weakness (generalized): Secondary | ICD-10-CM

## 2017-09-14 NOTE — Therapy (Signed)
[place], [place], [address] Phone: [phone]   Fax:  [phone]  Physical Therapy Treatment  Patient Details  Name: Terry Harmon MRN: [id number] Date of Birth: 10-05-1995 Referring Provider: Frankey Shown, MD   Encounter Date: 09/14/2017  PT End of Session - 09/14/17 1104    Visit Number  4    Number of Visits  12    PT Start Time  1104    PT Stop Time  1153    PT Time Calculation (min)  49 min    Activity Tolerance  Patient tolerated treatment well       Past Medical History:  Diagnosis Date  . 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 Mononucleosis    Age 22  . PCOS (polycystic ovarian syndrome)   . Seasonal allergies     Past Surgical History:  Procedure Laterality Date  . WISDOM TOOTH EXTRACTION  2018    There were no vitals filed for this visit.  Subjective Assessment - 09/14/17 1104    Subjective  Terry Harmon reports she is doing well after yesterdays visit    Currently in Pain?  No/denies                       [facility] Adult PT Treatment/Exercise - 09/14/17 0001      Lumbar Exercises: Stretches   Double Knee to Chest Stretch  20 seconds    Other Lumbar Stretch Exercise  childs pose      Lumbar Exercises: Aerobic   Stationary Bike  L2x5' with therapist present discussing progress      Lumbar Exercises: Standing   Side Lunge  15 reps with foot sliding out on cloth    Other Standing Lumbar Exercises  FWD step up on to BOSU ball with hip flex and hold       Lumbar Exercises: Seated   Other Seated Lumbar Exercises  in tricep press off EOB LAQ, VC to engage through shoulder blades and keep back straight       Lumbar Exercises: Supine   Bridge  5 reps 10 sec holds with feet on ball, arms crossed    Other Supine Lumbar Exercises  10 reps modified pilates table top. she had a lot of trouble maintaining a stable pelvis     Other Supine  Lumbar Exercises  10 reps PPT with single leg circles      Lumbar Exercises: Prone   Other Prone Lumbar Exercises  upper body lifts in limited ROM due to hypomobility 5x5sec holds each, arms at sides, T, W and Y, VC for form      Lumbar Exercises: Quadruped   Opposite Arm/Leg Raise  Right arm/Left leg;Left arm/Right leg;20 reps    Other Quadruped Lumbar Exercises  small knee lifts x5 with 5 sec holds for TA activation, VC for form to keep elbows soft not hyperextended      Modalities   Modalities  Moist Heat      Moist Heat Therapy   Number Minutes Moist Heat  10 Minutes    Moist Heat Location  Lumbar Spine                  PT Long Term Goals - 09/13/17 1114      PT LONG TERM GOAL #1   Title  Pt will be independent in her HEP and progression.  Status  On-going      PT LONG TERM GOAL #2   Title  Pt will be able to perform SLS activities with no trendelenburg present.     Status  On-going      PT LONG TERM GOAL #3   Title  Pt will improve her hip abduction/adduction to >/= 4+/5 in order to improve functional mobility.     Status  Partially Met      PT LONG TERM GOAL #4   Title  Pt will improve her FOTO score from 60% limitation to </= 50% limitaiton before returning to school.     Status  On-going            Plan - 09/14/17 1124    Clinical Impression Statement  Terry Harmon had a lot of difficulty holding her pelvis still while performing core work today, needs to work on deep abdominal stabilizatoin.     Rehab Potential  Good    PT Frequency  3x / week    PT Duration  4 weeks    PT Treatment/Interventions  Electrical Stimulation;Moist Heat;Balance training;Therapeutic exercise;Therapeutic activities;Functional mobility training;Gait training;Stair training;Neuromuscular re-education;Patient/family education;Manual techniques;Passive range of motion;Taping;Dry needling    PT Next Visit Plan  cont core stabilzation and hip strengthening, focus of deep core     Consulted and Agree with Plan of Care  Patient       Patient will benefit from skilled therapeutic intervention in order to improve the following deficits and impairments:  Pain, Postural dysfunction, Difficulty walking, Hypermobility, Decreased activity tolerance, Decreased strength  Visit Diagnosis: Pain in left hip  Pain in right hip  Difficulty in walking, not elsewhere classified  Muscle weakness (generalized)     Problem List Patient Active Problem List   Diagnosis Date Noted  . Pain of both hip joints 08/23/2017  . Abnormal blood sugar 11/29/2014  . Irregular menstrual cycle 02/26/2014  . Multiple environmental allergies 11/04/2013  . ECZEMA, ATOPIC DERMATITIS 04/07/2006    Jeral Pinch PT  09/14/2017, 11:46 AM  [facility] [address] [place], [place], [address] Phone: [phone]   Fax:  [phone]  Name: Terry Harmon MRN: [id number] Date of Birth: 1995/07/11

## 2017-09-15 ENCOUNTER — Encounter

## 2017-09-15 ENCOUNTER — Encounter: Payer: BLUE CROSS/BLUE SHIELD | Admitting: Physical Therapy

## 2017-09-16 ENCOUNTER — Encounter

## 2017-09-19 ENCOUNTER — Ambulatory Visit: Payer: BLUE CROSS/BLUE SHIELD | Admitting: Physical Therapy

## 2017-09-19 ENCOUNTER — Encounter: Payer: Self-pay | Admitting: Physical Therapy

## 2017-09-19 DIAGNOSIS — M25551 Pain in right hip: Secondary | ICD-10-CM | POA: Diagnosis not present

## 2017-09-19 DIAGNOSIS — M25552 Pain in left hip: Secondary | ICD-10-CM | POA: Diagnosis not present

## 2017-09-19 DIAGNOSIS — M6281 Muscle weakness (generalized): Secondary | ICD-10-CM

## 2017-09-19 DIAGNOSIS — R262 Difficulty in walking, not elsewhere classified: Secondary | ICD-10-CM | POA: Diagnosis not present

## 2017-09-19 NOTE — Therapy (Signed)
[place], [place], [address] Phone: [phone]   Fax:  [phone]  Physical Therapy Treatment  Patient Details  Name: Terry Harmon MRN: [id number] Date of Birth: 12-14-1995 Referring Provider: Frankey Shown, MD   Encounter Date: 09/19/2017  PT End of Session - 09/19/17 1206    Visit Number  5    Number of Visits  12    PT Start Time  6283    PT Stop Time  1225    PT Time Calculation (min)  40 min    Activity Tolerance  Patient tolerated treatment well    Behavior During Therapy  [facility] for tasks assessed/performed       Past Medical History:  Diagnosis Date  . 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 Mononucleosis    Age 22  . PCOS (polycystic ovarian syndrome)   . Seasonal allergies     Past Surgical History:  Procedure Laterality Date  . WISDOM TOOTH EXTRACTION  2018    There were no vitals filed for this visit.  Subjective Assessment - 09/19/17 1150    Subjective  No pain , has not had in while.  Sore in lower abs.           [facility] Adult PT Treatment/Exercise - 09/19/17 0001      Pilates   Pilates Reformer  1 Red spring KNee stretches x 10, added oblique x 5, Reverse Abd 1 lblue with variations , bridging 2 Red 1 blue cues to slow down: added press out and march         Pilates Reformer used for LE/core strength, postural strength, lumbopelvic disassociation and core control.  Exercises included:  Footwork 2 Red 1 blue used ball under sacrum to destabilize   Parallel, turnout double and single leg  Bridging x 10 articulating   Supine Arm Arcs 1 Red 1 yellow x 10  Supine abs x 10 1 Red 1 yellow    PT Long Term Goals - 09/13/17 1114      PT LONG TERM GOAL #1   Title  Pt will be independent in her HEP and progression.     Status  On-going      PT LONG TERM GOAL #2   Title  Pt will be able to perform SLS activities with no trendelenburg  present.     Status  On-going      PT LONG TERM GOAL #3   Title  Pt will improve her hip abduction/adduction to >/= 4+/5 in order to improve functional mobility.     Status  Partially Met      PT LONG TERM GOAL #4   Title  Pt will improve her FOTO score from 60% limitation to </= 50% limitaiton before returning to school.     Status  On-going            Plan - 09/19/17 1208    Clinical Impression Statement  Worked on lumbopelvic stability using the Pilates Reformer. Needs tactile cueing for maintaining pelvic stabiity with many exercises.  Recommended she continue PT in [place] at a location that offers Pilates.     PT Treatment/Interventions  Radiographer, therapeutic;Therapeutic exercise;Therapeutic activities;Functional mobility training;Gait training;Stair training;Neuromuscular re-education;Patient/family education;Manual techniques;Passive range of motion;Taping;Dry needling    PT Next Visit Plan  cont core stabilzation and hip strengthening, focus of deep core, Pilates , check goals FOTO , 2 more  visits     PT Home Exercise Plan  SLR posture alignment correction while looking in mirror, bridging, clam , SL abd and SL clam with band     Consulted and Agree with Plan of Care  Patient       Patient will benefit from skilled therapeutic intervention in order to improve the following deficits and impairments:  Pain, Postural dysfunction, Difficulty walking, Hypermobility, Decreased activity tolerance, Decreased strength  Visit Diagnosis: Pain in left hip  Pain in right hip  Difficulty in walking, not elsewhere classified  Muscle weakness (generalized)     Problem List Patient Active Problem List   Diagnosis Date Noted  . Pain of both hip joints 08/23/2017  . Abnormal blood sugar 11/29/2014  . Irregular menstrual cycle 02/26/2014  . Multiple environmental allergies 11/04/2013  . ECZEMA, ATOPIC DERMATITIS 04/07/2006     PAA,[name] 09/19/2017, 1:45 PM  [facility] [address] [place], [place], [address] Phone: [phone]   Fax:  [phone]  Name: Terry Harmon MRN: [id number] Date of Birth: Nov 13, 1995  Raeford Razor, PT 09/19/17 1:45 PM Phone: [phone] Fax: [phone]

## 2017-09-21 ENCOUNTER — Encounter: Payer: Self-pay | Admitting: Physical Therapy

## 2017-09-21 ENCOUNTER — Ambulatory Visit: Payer: BLUE CROSS/BLUE SHIELD | Admitting: Physical Therapy

## 2017-09-21 DIAGNOSIS — M25551 Pain in right hip: Secondary | ICD-10-CM

## 2017-09-21 DIAGNOSIS — R262 Difficulty in walking, not elsewhere classified: Secondary | ICD-10-CM | POA: Diagnosis not present

## 2017-09-21 DIAGNOSIS — M6281 Muscle weakness (generalized): Secondary | ICD-10-CM

## 2017-09-21 DIAGNOSIS — M25552 Pain in left hip: Secondary | ICD-10-CM

## 2017-09-21 NOTE — Therapy (Signed)
[place], [place], [address] Phone: [phone]   Fax:  [phone]  Physical Therapy Treatment  Patient Details  Name: VENIA RIVERON MRN: [id number] Date of Birth: [birth year] Referring Provider: Frankey Shown, MD   Encounter Date: 09/21/2017  PT End of Session - 09/21/17 1155    Visit Number  6    Number of Visits  12    PT Start Time  1146    PT Stop Time  1228    PT Time Calculation (min)  42 min    Activity Tolerance  Patient tolerated treatment well    Behavior During Therapy  [facility] for tasks assessed/performed       Past Medical History:  Diagnosis Date  . 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 Mononucleosis    Age 9  . PCOS (polycystic ovarian syndrome)   . Seasonal allergies     Past Surgical History:  Procedure Laterality Date  . WISDOM TOOTH EXTRACTION  2018    There were no vitals filed for this visit.  Subjective Assessment - 09/21/17 1147    Subjective  No pain has not had any recently.  Was sore in upper traps and but also was helping a friend move that day.     Currently in Pain?  No/denies       Pilates Tower for LE/Core strength, postural strength, lumbopelvic disassociation and core control.  Exercises included: Supine Leg [place] 1 yellow  Single leg arcs parallel and turnout x 10 each   Circles   Double leg Arcs in parallel, turnout and also turn in (hip IR)   Squats x 10   Scissors x 10   Sidelying Leg [place]  Hip abduction, adduction  Flexion and extension  Circles  Needed UE assist for sidelying stability, hyperextends L spine with hip extension.  Hikes hip in sidelying. Posteriorly tilts (pelvis) with hip flexion.  Noted tibial internal rotation bilat.        [facility] Adult PT Treatment/Exercise - 09/21/17 0001      Pilates   Pilates Tower  See note              PT Education - 09/21/17 1154    Education  Details  Pilates Tower     Person(s) Educated  Patient    Methods  Explanation    Comprehension  Verbalized understanding          PT Long Term Goals - 09/13/17 1114      PT LONG TERM GOAL #1   Title  Pt will be independent in her HEP and progression.     Status  On-going      PT LONG TERM GOAL #2   Title  Pt will be able to perform SLS activities with no trendelenburg present.     Status  On-going      PT LONG TERM GOAL #3   Title  Pt will improve her hip abduction/adduction to >/= 4+/5 in order to improve functional mobility.     Status  Partially Met      PT LONG TERM GOAL #4   Title  Pt will improve her FOTO score from 60% limitation to </= 50% limitaiton before returning to school.     Status  On-going            Plan - 09/21/17 1534    Clinical Impression Statement  Continued to address  joint hypermobility and proprioception with the Pilates equipment.  She had difficulty stabilizing knee without bending it.  Needed a fair amount of cueing for precision and control.  She has 1 more appt.     PT Next Visit Plan  goals, FOTO, DC finalize HEP     PT Home Exercise Plan  SLR posture alignment correction while looking in mirror, bridging, clam , SL abd and SL clam with band     Consulted and Agree with Plan of Care  Patient       Patient will benefit from skilled therapeutic intervention in order to improve the following deficits and impairments:  Pain, Postural dysfunction, Difficulty walking, Hypermobility, Decreased activity tolerance, Decreased strength  Visit Diagnosis: Pain in left hip  Pain in right hip  Difficulty in walking, not elsewhere classified  Muscle weakness (generalized)     Problem List Patient Active Problem List   Diagnosis Date Noted  . Pain of both hip joints 08/23/2017  . Abnormal blood sugar 11/29/2014  . Irregular menstrual cycle 02/26/2014  . Multiple environmental allergies 11/04/2013  . ECZEMA, ATOPIC DERMATITIS 04/07/2006     , 09/21/2017, 3:37 PM  [facility] [address] [place], [place], [address] Phone: [phone]   Fax:  [phone]  Name: SHALLON YAKLIN MRN: [id number] Date of Birth: 1995/07/25   Raeford Razor, PT 09/21/17 3:40 PM Phone: [phone] Fax: [phone]

## 2017-09-23 ENCOUNTER — Ambulatory Visit: Payer: BLUE CROSS/BLUE SHIELD | Admitting: Physical Therapy

## 2017-09-23 DIAGNOSIS — M25552 Pain in left hip: Secondary | ICD-10-CM | POA: Diagnosis not present

## 2017-09-23 DIAGNOSIS — R262 Difficulty in walking, not elsewhere classified: Secondary | ICD-10-CM | POA: Diagnosis not present

## 2017-09-23 DIAGNOSIS — M6281 Muscle weakness (generalized): Secondary | ICD-10-CM

## 2017-09-23 DIAGNOSIS — M25551 Pain in right hip: Secondary | ICD-10-CM | POA: Diagnosis not present

## 2017-09-23 NOTE — Therapy (Signed)
[place], [place], [address] Phone: [phone]   Fax:  [phone]  Physical Therapy Treatment  Patient Details  Name: Terry Harmon MRN: [id number] Date of Birth: 07-17-1995 Referring Provider: Frankey Shown, MD   Encounter Date: 09/23/2017  PT End of Session - 09/23/17 1207    Visit Number  7    Number of Visits  12    PT Start Time  1150    PT Stop Time  1230    PT Time Calculation (min)  40 min    Activity Tolerance  Patient tolerated treatment well    Behavior During Therapy  [facility] for tasks assessed/performed       Past Medical History:  Diagnosis Date  . 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 Mononucleosis    Age 22  . PCOS (polycystic ovarian syndrome)   . Seasonal allergies     Past Surgical History:  Procedure Laterality Date  . WISDOM TOOTH EXTRACTION  2018    There were no vitals filed for this visit.  Subjective Assessment - 09/23/17 1207    Subjective  My hip popped the other day when I moved. No pain now     Currently in Pain?  No/denies         [facility] PT Assessment - 09/23/17 0001      Observation/Other Assessments   Focus on Therapeutic Outcomes (FOTO)   29%      Strength   Right Hip Flexion  4+/5    Right Hip Extension  5/5    Right Hip ABduction  4+/5    Right Hip ADduction  4/5    Left Hip Flexion  4/5    Left Hip Extension  5/5    Left Hip ABduction  4+/5    Left Hip ADduction  4-/5          [facility] Adult PT Treatment/Exercise - 09/23/17 0001      Lumbar Exercises: Supine   Clam  20 reps    Clam Limitations  blue band     Bridge  10 reps    Other Supine Lumbar Exercises  bridge with clam and SLR x 10       Lumbar Exercises: Sidelying   Other Sidelying Lumbar Exercises  hip abd x 10 , hip adduction x 10       Knee/Hip Exercises: Standing   Step Down  Right;Left;2 sets;15 reps;Hand Hold: 1;Hand Hold: 0;Step  Height: 6";Step Height: 8"    Step Down Limitations  alingment used mirror     Other Standing Knee Exercises  single leg hip hinge x 10 each side, needed manual cues to maintain level pelvis              PT Education - 09/23/17 1233    Education Details  HEP progression, variation and form, sit to stand single leg     Person(s) Educated  Patient    Methods  Explanation    Comprehension  Verbalized understanding          PT Long Term Goals - 09/23/17 1208      PT LONG TERM GOAL #1   Title  Pt will be independent in her HEP and progression.     Status  Achieved      PT LONG TERM GOAL #2   Title  Pt will be able to perform SLS activities with no trendelenburg present.  PT LONG TERM GOAL #3   Title  Pt will improve her hip abduction/adduction to >/= 4+/5 in order to improve functional mobility.     Baseline  adduction weaker 4-/5 to 4/5    Status  Partially Met      PT LONG TERM GOAL #4   Title  Pt will improve her FOTO score from 60% limitation to </= 50% limitaiton before returning to school.     Status  Achieved            Plan - 09/23/17 1210    Clinical Impression Statement  Patient moves back to [facility] tomorrow.  She has improved her hip strength and has had very little pain since beginning PT.  Her FOTO score improves to only 29% limited.  She is DC from PT and may resume if needed in [place] to cont her progress.     PT Treatment/Interventions  Radiographer, therapeutic;Therapeutic exercise;Therapeutic activities;Functional mobility training;Gait training;Stair training;Neuromuscular re-education;Patient/family education;Manual techniques;Passive range of motion;Taping;Dry needling    Consulted and Agree with Plan of Care  Patient       Patient will benefit from skilled therapeutic intervention in order to improve the following deficits and impairments:  Pain, Postural dysfunction, Difficulty walking, Hypermobility, Decreased  activity tolerance, Decreased strength  Visit Diagnosis: Pain in left hip  Pain in right hip  Difficulty in walking, not elsewhere classified  Muscle weakness (generalized)     Problem List Patient Active Problem List   Diagnosis Date Noted  . Pain of both hip joints 08/23/2017  . Abnormal blood sugar 11/29/2014  . Irregular menstrual cycle 02/26/2014  . Multiple environmental allergies 11/04/2013  . ECZEMA, ATOPIC DERMATITIS 04/07/2006    Burdette Gergely 09/23/2017, 12:35 PM  [facility] [address] [place], [place], [address] Phone: [phone]   Fax:  [phone]  Name: Terry Harmon MRN: [id number] Date of Birth: 1995-12-20   PHYSICAL THERAPY DISCHARGE SUMMARY  Visits from Start of Care: 7  Current functional level related to goals / functional outcomes: Not limited by pain at all See above    Remaining deficits: Min weakness in hips, core , proprioception    Education / Equipment: HEP, posture, pilates, jt stability  Plan: Patient agrees to discharge.  Patient goals were met. Patient is being discharged due to meeting the stated rehab goals.  ?????     Raeford Razor, PT 09/23/17 12:37 PM Phone: [phone] Fax: [phone]

## 2017-09-23 NOTE — Patient Instructions (Signed)
Reprinted HEP

## 2018-01-08 HISTORY — PX: NASAL SEPTUM SURGERY: SHX37

## 2018-01-11 NOTE — H&P (Signed)
  HPI:   Terry Harmon is a 22 y.o. female who presents as a consult Patient.   Referring Provider: Casper Harrisonees, Janet Lee, MD  Chief complaint: Nasal and sinus problem.  HPI: Lifelong history of severe seasonal allergies and perennial allergies. Fall and winter seem to be the worst for her. She has been allergy tested in the past and found sensitive to multiple items. She takes antihistamine therapy year-round. She has been having some bad migrainous type headaches recently. She takes Tylenol for that. She has chronic nasal obstruction, congestion and discharge. She has been on multiple rounds of antibiotics for presumed sinusitis. She typically has trouble all the wintertime following the first upper respiratory infection of the season. She is currently a [employer] freshman [place].  PMH/Meds/All/SocHx/FamHx/ROS:   Past Medical History:  Diagnosis Date  . Hypothyroidism  . Migraine   Past Surgical History:  Procedure Laterality Date  . MOUTH SURGERY   No family history of bleeding disorders, wound healing problems or difficulty with anesthesia.   Social History   Social History  . Marital status: Single  Spouse name: N/A  . Number of children: N/A  . Years of education: N/A   Occupational History  . Not on file.   Social History Main Topics  . Smoking status: Never Smoker  . Smokeless tobacco: Never Used  . Alcohol use No  . Drug use: No  . Sexual activity: Not on file   Other Topics Concern  . Not on file   Social History Narrative  . No narrative on file   Current Outpatient Prescriptions:  . cetirizine (ZYRTEC) 10 MG tablet, Take 10 mg by mouth., Disp: , Rfl:  . multivit-minerals/ferrous fum (MULTI VITAMIN ORAL), Take by mouth., Disp: , Rfl:   A complete ROS was performed with pertinent positives/negatives noted in the HPI. The remainder of the ROS are negative.   Physical Exam:   Ht 1.727 m (5\' 8" )  Wt (!) 12.7 kg (28 lb)  BMI 4.26 kg/m   General: Healthy  and alert, in no distress, breathing easily. Normal affect. In a pleasant mood. Head: Normocephalic, atraumatic. No masses, or scars. Eyes: Pupils are equal, and reactive to light. Vision is grossly intact. No spontaneous or gaze nystagmus. Ears: Ear canals are clear. Tympanic membranes are intact, with normal landmarks and the middle ears are clear and healthy. Hearing: Grossly normal. Nose: Nasal cavities reveal mucosal edema with clear mucus exudate, some cloudy exudate in the left middle meatus. Face: No masses or scars, facial nerve function is symmetric. Oral Cavity: No mucosal abnormalities are noted. Tongue with normal mobility. Dentition appears healthy. Oropharynx: Tonsils are symmetric. There are no mucosal masses identified. Tongue base appears normal and healthy. Larynx/Hypopharynx: deferred Chest: Deferred Neck: No palpable masses, no cervical adenopathy, no thyroid nodules or enlargement. Neuro: Cranial nerves II-XII with normal function. Balance: Normal gate. Other findings: none.  Independent Review of Additional Tests or Records:  none  Procedures:  none  Impression & Plans:  Chronic allergic rhinitis, chronic migraine, possible chronic sinusitis. She has already been on several rounds of antibiotics. Recommend CT imaging of the sinuses to further evaluate if there is any anatomic or chronic inflammatory disease that may require surgical intervention.

## 2018-01-20 DIAGNOSIS — J342 Deviated nasal septum: Secondary | ICD-10-CM | POA: Insufficient documentation

## 2018-01-20 DIAGNOSIS — J324 Chronic pansinusitis: Secondary | ICD-10-CM | POA: Diagnosis not present

## 2018-01-20 DIAGNOSIS — J3089 Other allergic rhinitis: Secondary | ICD-10-CM | POA: Diagnosis not present

## 2018-01-20 DIAGNOSIS — J343 Hypertrophy of nasal turbinates: Secondary | ICD-10-CM | POA: Insufficient documentation

## 2018-01-20 DIAGNOSIS — J322 Chronic ethmoidal sinusitis: Secondary | ICD-10-CM | POA: Diagnosis not present

## 2018-01-23 ENCOUNTER — Encounter (HOSPITAL_COMMUNITY)
Admission: RE | Admit: 2018-01-23 | Discharge: 2018-01-23 | Disposition: A | Discharge status: Home / Self Care | Payer: BLUE CROSS/BLUE SHIELD | Source: Ambulatory Visit | Attending: Otolaryngology | Admitting: Otolaryngology

## 2018-01-23 ENCOUNTER — Encounter (HOSPITAL_COMMUNITY): Payer: Self-pay

## 2018-01-23 DIAGNOSIS — Z01818 Encounter for other preprocedural examination: Secondary | ICD-10-CM | POA: Diagnosis not present

## 2018-01-23 DIAGNOSIS — E119 Type 2 diabetes mellitus without complications: Secondary | ICD-10-CM | POA: Diagnosis not present

## 2018-01-23 HISTORY — DX: Prediabetes: R73.03

## 2018-01-23 HISTORY — DX: Raynaud's syndrome without gangrene: I73.00

## 2018-01-23 HISTORY — DX: Dermatitis, unspecified: L30.9

## 2018-01-23 HISTORY — DX: Other specified postprocedural states: Z98.890

## 2018-01-23 HISTORY — DX: Anxiety disorder, unspecified: F41.9

## 2018-01-23 HISTORY — DX: Other specified postprocedural states: R11.2

## 2018-01-23 HISTORY — DX: Deviated nasal septum: J34.2

## 2018-01-23 LAB — BASIC METABOLIC PANEL
Anion gap: 8 (ref 5–15)
BUN: 12 mg/dL (ref 6–20)
CALCIUM: 9.4 mg/dL (ref 8.9–10.3)
CO2: 26 mmol/L (ref 22–32)
CREATININE: 0.77 mg/dL (ref 0.44–1.00)
Chloride: 105 mmol/L (ref 98–111)
GFR calc non Af Amer: >60 mL/min (ref >60)
Glucose, Bld: 96 mg/dL (ref 70–99)
Potassium: 4 mmol/L (ref 3.5–5.1)
Sodium: 139 mmol/L (ref 135–145)

## 2018-01-23 LAB — CBC
HEMATOCRIT: 47 % — AB (ref 36.0–46.0)
Hemoglobin: 15.3 g/dL — ABNORMAL HIGH (ref 12.0–15.0)
MCH: 30.2 pg (ref 26.0–34.0)
MCHC: 32.6 g/dL (ref 30.0–36.0)
MCV: 92.7 fL (ref 80.0–100.0)
Platelets: 209 10*3/uL (ref 150–400)
RBC: 5.07 MIL/uL (ref 3.87–5.11)
RDW: 12 % (ref 11.5–15.5)
WBC: 7.3 10*3/uL (ref 4.0–10.5)
nRBC: 0 % (ref 0.0–0.2)

## 2018-01-23 LAB — HEMOGLOBIN A1C
Hgb A1c MFr Bld: 4.8 % (ref 4.8–5.6)
MEAN PLASMA GLUCOSE: 91.06 mg/dL

## 2018-01-23 NOTE — Pre-Procedure Instructions (Signed)
Terry Harmon  01/23/2018      [id number] [place] - [phone] [place] [id number] Phone: [phone] Fax: [phone]    Your procedure is scheduled on 01/25/18.  [facility] Admitting at 8 A.M.  Call this number if you have problems the morning of surgery:  [phone]   Remember:  Do not eat or drink after midnight.  Y   Take these medicines the morning of surgery with A SIP OF WATER ---all inhalers,zyrtec    Do not wear jewelry, make-up or nail polish.  Do not wear lotions, powders, or perfumes, or deodorant.  Do not shave 48 hours prior to surgery.  Men may shave face and neck.  Do not bring valuables to the hospital.  [facility] is not responsible for any belongings or valuables.  Contacts, dentures or bridgework may not be worn into surgery.  Leave your suitcase in the car.  After surgery it may be brought to your room.  For patients admitted to the hospital, discharge time will be determined by your treatment team.  Patients discharged the day of surgery will not be allowed to drive home.   Name and phone number of your driver:   Do not take any aspirin,anti-inflammatories,vitamins,or herbal supplements 5-7 days prior to surgery. Special instructions:  [facility] - Preparing for Surgery  Before surgery, you can play an important role.  Because skin is not sterile, your skin needs to be as free of germs as possible.  You can reduce the number of germs on you skin by washing with CHG (chlorahexidine gluconate) soap before surgery.  CHG is an antiseptic cleaner which kills germs and bonds with the skin to continue killing germs even after washing.  Oral Hygiene is also important in reducing the risk of infection.  Remember to brush your teeth with your regular toothpaste the morning of surgery.  Please DO NOT use if you have an allergy to CHG or antibacterial soaps.  If your skin becomes reddened/irritated  stop using the CHG and inform your nurse when you arrive at Short Stay.  Do not shave (including legs and underarms) for at least 48 hours prior to the first CHG shower.  You may shave your face.  Please follow these instructions carefully:   1.  Shower with CHG Soap the night before surgery and the morning of Surgery.  2.  If you choose to wash your hair, wash your hair first as usual with your normal shampoo.  3.  After you shampoo, rinse your hair and body thoroughly to remove the shampoo. 4.  Use CHG as you would any other liquid soap.  You can apply chg directly to the skin and wash gently with a      scrungie or washcloth.           5.  Apply the CHG Soap to your body ONLY FROM THE NECK DOWN.   Do not use on open wounds or open sores. Avoid contact with your eyes, ears, mouth and genitals (private parts).  Wash genitals (private parts) with your normal soap.  6.  Wash thoroughly, paying special attention to the area where your surgery will be performed.  7.  Thoroughly rinse your body with warm water from the neck down.  8.  DO NOT shower/wash with your normal soap after using and rinsing off the CHG Soap.  9.  Pat yourself dry with a clean towel.  10.  Wear clean pajamas.            11.  Place clean sheets on your bed the night of your first shower and do not sleep with pets.  Day of Surgery  Do not apply any lotions/deoderants the morning of surgery.   Please wear clean clothes to the hospital/surgery center. Remember to brush your teeth with toothpaste.   Please read over the following fact sheets that you were given.

## 2018-01-25 ENCOUNTER — Encounter (HOSPITAL_COMMUNITY): Payer: Self-pay

## 2018-01-25 ENCOUNTER — Ambulatory Visit (HOSPITAL_COMMUNITY): Payer: BLUE CROSS/BLUE SHIELD | Admitting: Certified Registered"

## 2018-01-25 ENCOUNTER — Other Ambulatory Visit: Payer: Self-pay

## 2018-01-25 ENCOUNTER — Observation Stay (HOSPITAL_COMMUNITY)
Admission: RE | Admit: 2018-01-25 | Discharge: 2018-01-26 | Disposition: A | Discharge status: Home / Self Care | Payer: BLUE CROSS/BLUE SHIELD | Attending: Otolaryngology | Admitting: Otolaryngology

## 2018-01-25 ENCOUNTER — Encounter (HOSPITAL_COMMUNITY)
Admission: RE | Disposition: A | Discharge status: Home / Self Care | Payer: Self-pay | Source: Home / Self Care | Attending: Otolaryngology

## 2018-01-25 DIAGNOSIS — J3489 Other specified disorders of nose and nasal sinuses: Secondary | ICD-10-CM | POA: Diagnosis present

## 2018-01-25 DIAGNOSIS — Z881 Allergy status to other antibiotic agents status: Secondary | ICD-10-CM | POA: Diagnosis not present

## 2018-01-25 DIAGNOSIS — J342 Deviated nasal septum: Principal | ICD-10-CM | POA: Insufficient documentation

## 2018-01-25 DIAGNOSIS — J329 Chronic sinusitis, unspecified: Secondary | ICD-10-CM | POA: Insufficient documentation

## 2018-01-25 DIAGNOSIS — J343 Hypertrophy of nasal turbinates: Secondary | ICD-10-CM | POA: Insufficient documentation

## 2018-01-25 DIAGNOSIS — Z79899 Other long term (current) drug therapy: Secondary | ICD-10-CM | POA: Diagnosis not present

## 2018-01-25 DIAGNOSIS — J309 Allergic rhinitis, unspecified: Secondary | ICD-10-CM | POA: Insufficient documentation

## 2018-01-25 DIAGNOSIS — J339 Nasal polyp, unspecified: Secondary | ICD-10-CM | POA: Diagnosis not present

## 2018-01-25 DIAGNOSIS — J324 Chronic pansinusitis: Secondary | ICD-10-CM | POA: Diagnosis not present

## 2018-01-25 DIAGNOSIS — I10 Essential (primary) hypertension: Secondary | ICD-10-CM | POA: Diagnosis not present

## 2018-01-25 HISTORY — PX: ETHMOIDECTOMY: SHX5197

## 2018-01-25 HISTORY — DX: Hypotension, unspecified: I95.9

## 2018-01-25 HISTORY — DX: Family history of other specified conditions: Z84.89

## 2018-01-25 HISTORY — PX: NASAL SEPTOPLASTY W/ TURBINOPLASTY: SHX2070

## 2018-01-25 LAB — GLUCOSE, CAPILLARY
Glucose-Capillary: 79 mg/dL (ref 70–99)
Glucose-Capillary: 77 mg/dL (ref 70–99)
Glucose-Capillary: 93 mg/dL (ref 70–99)
Glucose-Capillary: 109 mg/dL — ABNORMAL HIGH (ref 70–99)
Glucose-Capillary: 171 mg/dL — ABNORMAL HIGH (ref 70–99)

## 2018-01-25 LAB — POCT PREGNANCY, URINE: Preg Test, Ur: NEGATIVE

## 2018-01-25 SURGERY — SEPTOPLASTY, NOSE, WITH NASAL TURBINATE REDUCTION
Anesthesia: General | Site: Nose | Laterality: Bilateral

## 2018-01-25 MED ORDER — LIDOCAINE-EPINEPHRINE 1 %-1:100000 IJ SOLN
INTRAMUSCULAR | Status: DC | PRN
  Administered 2018-01-25: 5 mL

## 2018-01-25 MED ORDER — PHENYLEPHRINE 40 MCG/ML (10ML) SYRINGE FOR IV PUSH (FOR BLOOD PRESSURE SUPPORT)
PREFILLED_SYRINGE | INTRAVENOUS | Status: AC
  Filled 2018-01-25: qty 10

## 2018-01-25 MED ORDER — ONDANSETRON HCL 4 MG/2ML IJ SOLN
INTRAMUSCULAR | Status: AC
  Administered 2018-01-25: 4 mg via INTRAVENOUS
  Filled 2018-01-25: qty 2

## 2018-01-25 MED ORDER — DEXTROSE-NACL 5-0.9 % IV SOLN
INTRAVENOUS | Status: DC
  Administered 2018-01-25: 14:00:00 via INTRAVENOUS

## 2018-01-25 MED ORDER — PROMETHAZINE HCL 25 MG RE SUPP
25.0000 mg | Freq: Four times a day (QID) | RECTAL | Status: DC | PRN
  Filled 2018-01-25: qty 1

## 2018-01-25 MED ORDER — ONDANSETRON HCL 4 MG/2ML IJ SOLN
INTRAMUSCULAR | Status: DC | PRN
  Administered 2018-01-25 (×2): 4 mg via INTRAVENOUS

## 2018-01-25 MED ORDER — LIDOCAINE 2% (20 MG/ML) 5 ML SYRINGE
INTRAMUSCULAR | Status: DC | PRN
  Administered 2018-01-25: 60 mg via INTRAVENOUS

## 2018-01-25 MED ORDER — LIDOCAINE-EPINEPHRINE 1 %-1:100000 IJ SOLN
INTRAMUSCULAR | Status: AC
  Filled 2018-01-25: qty 1

## 2018-01-25 MED ORDER — DEXAMETHASONE SODIUM PHOSPHATE 10 MG/ML IJ SOLN
INTRAMUSCULAR | Status: AC
  Filled 2018-01-25: qty 1

## 2018-01-25 MED ORDER — LIDOCAINE HCL 4 % EX SOLN
0.0000 mL | Freq: Once | CUTANEOUS | Status: DC | PRN
  Filled 2018-01-25: qty 50

## 2018-01-25 MED ORDER — LACTATED RINGERS IV SOLN
INTRAVENOUS | Status: DC
  Administered 2018-01-25 (×2): via INTRAVENOUS

## 2018-01-25 MED ORDER — SODIUM CHLORIDE 0.9 % IR SOLN
Status: DC | PRN
  Administered 2018-01-25: 1000 mL

## 2018-01-25 MED ORDER — OXYMETAZOLINE HCL 0.05 % NA SOLN
NASAL | Status: DC | PRN
  Administered 2018-01-25: 1

## 2018-01-25 MED ORDER — 0.9 % SODIUM CHLORIDE (POUR BTL) OPTIME
TOPICAL | Status: DC | PRN
  Administered 2018-01-25: 1000 mL

## 2018-01-25 MED ORDER — FENTANYL CITRATE (PF) 100 MCG/2ML IJ SOLN: 25.0000 ug | INTRAMUSCULAR | Status: DC | PRN

## 2018-01-25 MED ORDER — SCOPOLAMINE 1 MG/3DAYS TD PT72
MEDICATED_PATCH | TRANSDERMAL | Status: AC
  Filled 2018-01-25: qty 1

## 2018-01-25 MED ORDER — EPINEPHRINE 0.3 MG/0.3ML IJ SOAJ
0.3000 mg | Freq: Once | INTRAMUSCULAR | Status: DC | PRN
  Filled 2018-01-25: qty 0.3

## 2018-01-25 MED ORDER — HYDROCORTISONE 1 % EX CREA
1.0000 "application " | TOPICAL_CREAM | Freq: Two times a day (BID) | CUTANEOUS | Status: DC | PRN
  Filled 2018-01-25: qty 28

## 2018-01-25 MED ORDER — HYDROCODONE-ACETAMINOPHEN 5-325 MG PO TABS
1.0000 | ORAL_TABLET | ORAL | Status: DC | PRN
  Administered 2018-01-25 – 2018-01-26 (×4): 1 via ORAL
  Filled 2018-01-25 (×3): qty 1

## 2018-01-25 MED ORDER — SILVER NITRATE-POT NITRATE 75-25 % EX MISC
1.0000 | Freq: Once | CUTANEOUS | Status: DC | PRN
  Filled 2018-01-25: qty 1

## 2018-01-25 MED ORDER — BACITRACIN ZINC 500 UNIT/GM EX OINT
TOPICAL_OINTMENT | CUTANEOUS | Status: DC | PRN
  Administered 2018-01-25: 1 via TOPICAL

## 2018-01-25 MED ORDER — FENTANYL CITRATE (PF) 250 MCG/5ML IJ SOLN
INTRAMUSCULAR | Status: AC
  Filled 2018-01-25: qty 5

## 2018-01-25 MED ORDER — MIDAZOLAM HCL 5 MG/5ML IJ SOLN
INTRAMUSCULAR | Status: DC | PRN
  Administered 2018-01-25: 1 mg via INTRAVENOUS

## 2018-01-25 MED ORDER — ARTIFICIAL TEARS OPHTHALMIC OINT
TOPICAL_OINTMENT | OPHTHALMIC | Status: AC
  Filled 2018-01-25: qty 3.5

## 2018-01-25 MED ORDER — ONDANSETRON HCL 4 MG/2ML IJ SOLN
4.0000 mg | Freq: Once | INTRAMUSCULAR | Status: AC | PRN
  Administered 2018-01-25: 4 mg via INTRAVENOUS

## 2018-01-25 MED ORDER — SCOPOLAMINE 1 MG/3DAYS TD PT72
MEDICATED_PATCH | TRANSDERMAL | Status: DC | PRN
  Administered 2018-01-25: 1 via TRANSDERMAL

## 2018-01-25 MED ORDER — HYDROCODONE-ACETAMINOPHEN 5-325 MG PO TABS
ORAL_TABLET | ORAL | Status: AC
  Filled 2018-01-25: qty 1

## 2018-01-25 MED ORDER — FENTANYL CITRATE (PF) 100 MCG/2ML IJ SOLN
INTRAMUSCULAR | Status: DC | PRN
  Administered 2018-01-25: 25 ug via INTRAVENOUS
  Administered 2018-01-25 (×3): 50 ug via INTRAVENOUS
  Administered 2018-01-25: 25 ug via INTRAVENOUS
  Administered 2018-01-25: 50 ug via INTRAVENOUS

## 2018-01-25 MED ORDER — DEXAMETHASONE SODIUM PHOSPHATE 10 MG/ML IJ SOLN
INTRAMUSCULAR | Status: DC | PRN
  Administered 2018-01-25: 5 mg via INTRAVENOUS

## 2018-01-25 MED ORDER — ARTIFICIAL TEARS OPHTHALMIC OINT
TOPICAL_OINTMENT | OPHTHALMIC | Status: DC | PRN
  Administered 2018-01-25: 1 via OPHTHALMIC

## 2018-01-25 MED ORDER — LORATADINE 10 MG PO TABS
10.0000 mg | ORAL_TABLET | Freq: Every day | ORAL | Status: DC
  Administered 2018-01-26: 10 mg via ORAL
  Filled 2018-01-25: qty 1

## 2018-01-25 MED ORDER — OXYMETAZOLINE HCL 0.05 % NA SOLN
2.0000 | NASAL | Status: DC
  Administered 2018-01-25: 2 via NASAL
  Filled 2018-01-25: qty 15

## 2018-01-25 MED ORDER — DEXTROSE 50 % IV SOLN
INTRAVENOUS | Status: DC | PRN
  Administered 2018-01-25: 12.5 mL via INTRAVENOUS

## 2018-01-25 MED ORDER — PROPOFOL 10 MG/ML IV BOLUS
INTRAVENOUS | Status: DC | PRN
  Administered 2018-01-25: 150 mg via INTRAVENOUS

## 2018-01-25 MED ORDER — LIDOCAINE HCL URETHRAL/MUCOSAL 2 % EX GEL
1.0000 "application " | Freq: Once | CUTANEOUS | Status: DC | PRN
  Filled 2018-01-25: qty 5

## 2018-01-25 MED ORDER — TRIPLE ANTIBIOTIC 3.5-400-5000 EX OINT
1.0000 "application " | TOPICAL_OINTMENT | Freq: Once | CUTANEOUS | Status: DC | PRN
  Filled 2018-01-25: qty 1

## 2018-01-25 MED ORDER — PHENYLEPHRINE 40 MCG/ML (10ML) SYRINGE FOR IV PUSH (FOR BLOOD PRESSURE SUPPORT)
PREFILLED_SYRINGE | INTRAVENOUS | Status: DC | PRN
  Administered 2018-01-25 (×3): 40 ug via INTRAVENOUS

## 2018-01-25 MED ORDER — IBUPROFEN 100 MG/5ML PO SUSP
400.0000 mg | Freq: Four times a day (QID) | ORAL | Status: DC | PRN
  Filled 2018-01-25: qty 20

## 2018-01-25 MED ORDER — PROMETHAZINE HCL 25 MG PO TABS: 25.0000 mg | ORAL_TABLET | Freq: Four times a day (QID) | ORAL | Status: DC | PRN

## 2018-01-25 MED ORDER — DEXTROSE 50 % IV SOLN
INTRAVENOUS | Status: AC
  Filled 2018-01-25: qty 50

## 2018-01-25 MED ORDER — OXYCODONE HCL 5 MG PO TABS: 5.0000 mg | ORAL_TABLET | Freq: Once | ORAL | Status: DC | PRN

## 2018-01-25 MED ORDER — OXYMETAZOLINE HCL 0.05 % NA SOLN
1.0000 | Freq: Once | NASAL | Status: DC | PRN
  Filled 2018-01-25: qty 15

## 2018-01-25 MED ORDER — ADULT MULTIVITAMIN W/MINERALS CH
1.0000 | ORAL_TABLET | Freq: Every day | ORAL | Status: DC
  Administered 2018-01-25 – 2018-01-26 (×2): 1 via ORAL
  Filled 2018-01-25 (×2): qty 1

## 2018-01-25 MED ORDER — SUGAMMADEX SODIUM 200 MG/2ML IV SOLN
INTRAVENOUS | Status: DC | PRN
  Administered 2018-01-25: 100 mg via INTRAVENOUS

## 2018-01-25 MED ORDER — MIDAZOLAM HCL 2 MG/2ML IJ SOLN
INTRAMUSCULAR | Status: AC
  Filled 2018-01-25: qty 2

## 2018-01-25 MED ORDER — LIDOCAINE-EPINEPHRINE (PF) 1 %-1:200000 IJ SOLN
0.0000 mL | Freq: Once | INTRAMUSCULAR | Status: DC | PRN
  Filled 2018-01-25: qty 30

## 2018-01-25 MED ORDER — ALBUTEROL SULFATE (2.5 MG/3ML) 0.083% IN NEBU: 3.0000 mL | INHALATION_SOLUTION | Freq: Four times a day (QID) | RESPIRATORY_TRACT | Status: DC | PRN

## 2018-01-25 MED ORDER — BACITRACIN ZINC 500 UNIT/GM EX OINT
TOPICAL_OINTMENT | CUTANEOUS | Status: AC
  Filled 2018-01-25: qty 28.35

## 2018-01-25 MED ORDER — ONDANSETRON HCL 4 MG/2ML IJ SOLN
INTRAMUSCULAR | Status: AC
  Filled 2018-01-25: qty 4

## 2018-01-25 MED ORDER — ROCURONIUM BROMIDE 50 MG/5ML IV SOSY
PREFILLED_SYRINGE | INTRAVENOUS | Status: AC
  Filled 2018-01-25: qty 5

## 2018-01-25 MED ORDER — LIDOCAINE 2% (20 MG/ML) 5 ML SYRINGE
INTRAMUSCULAR | Status: AC
  Filled 2018-01-25: qty 5

## 2018-01-25 MED ORDER — ROCURONIUM BROMIDE 100 MG/10ML IV SOLN
INTRAVENOUS | Status: DC | PRN
  Administered 2018-01-25: 50 mg via INTRAVENOUS

## 2018-01-25 MED ORDER — OXYCODONE HCL 5 MG/5ML PO SOLN: 5.0000 mg | Freq: Once | ORAL | Status: DC | PRN

## 2018-01-25 SURGICAL SUPPLY — 43 items
ATTRACTOMAT 16X20 MAGNETIC DRP (DRAPES) IMPLANT
BLADE RAD40 ROTATE 4M 4 5PK (BLADE) IMPLANT
BLADE RAD60 ROTATE M4 4 5PK (BLADE) IMPLANT
BLADE SURG 15 STRL LF DISP TIS (BLADE) IMPLANT
BLADE SURG 15 STRL SS (BLADE)
BLADE TRICUT ROTATE M4 4 5PK (BLADE) IMPLANT
CANISTER SUCT 3000ML PPV (MISCELLANEOUS) ×2 IMPLANT
COAGULATOR SUCT SWTCH 10FR 6 (ELECTROSURGICAL) IMPLANT
CONT SPEC 4OZ CLIKSEAL STRL BL (MISCELLANEOUS) ×2 IMPLANT
COVER WAND RF STERILE (DRAPES) ×2 IMPLANT
CRADLE DONUT ADULT HEAD (MISCELLANEOUS) ×2 IMPLANT
DRAPE HALF SHEET 40X57 (DRAPES) IMPLANT
DRESSING NASAL KENNEDY 3.5X.9 (MISCELLANEOUS) IMPLANT
DRESSING TELFA 8X10 (GAUZE/BANDAGES/DRESSINGS) ×2 IMPLANT
DRSG NASAL KENNEDY 3.5X.9 (MISCELLANEOUS)
DRSG NASOPORE 8CM (GAUZE/BANDAGES/DRESSINGS) ×2 IMPLANT
DRSG TELFA 3X8 NADH (GAUZE/BANDAGES/DRESSINGS) ×2 IMPLANT
ELECT REM PT RETURN 9FT ADLT (ELECTROSURGICAL)
ELECTRODE REM PT RTRN 9FT ADLT (ELECTROSURGICAL) IMPLANT
GAUZE SPONGE 2X2 8PLY STRL LF (GAUZE/BANDAGES/DRESSINGS) ×1 IMPLANT
GLOVE ECLIPSE 7.5 STRL STRAW (GLOVE) ×4 IMPLANT
GOWN STRL REUS W/ TWL LRG LVL3 (GOWN DISPOSABLE) ×2 IMPLANT
GOWN STRL REUS W/TWL LRG LVL3 (GOWN DISPOSABLE) ×2
KIT BASIN OR (CUSTOM PROCEDURE TRAY) ×2 IMPLANT
KIT TURNOVER KIT B (KITS) ×2 IMPLANT
NEEDLE PRECISIONGLIDE 27X1.5 (NEEDLE) ×2 IMPLANT
NS IRRIG 1000ML POUR BTL (IV SOLUTION) ×2 IMPLANT
PAD ARMBOARD 7.5X6 YLW CONV (MISCELLANEOUS) ×4 IMPLANT
PATTIES SURGICAL .5 X3 (DISPOSABLE) ×2 IMPLANT
SHEATH ENDOSCRUB 0 DEG (SHEATH) IMPLANT
SHEATH ENDOSCRUB 30 DEG (SHEATH) IMPLANT
SPECIMEN JAR SMALL (MISCELLANEOUS) ×2 IMPLANT
SPONGE GAUZE 2X2 STER 10/PKG (GAUZE/BANDAGES/DRESSINGS) ×1
SUT CHROMIC 4 0 P 3 18 (SUTURE) ×2 IMPLANT
SUT ETHILON 3 0 PS 1 (SUTURE) IMPLANT
SUT PLAIN 4 0 ~~LOC~~ 1 (SUTURE) ×2 IMPLANT
SWAB COLLECTION DEVICE MRSA (MISCELLANEOUS) IMPLANT
SWAB CULTURE ESWAB REG 1ML (MISCELLANEOUS) IMPLANT
SYR 50ML SLIP (SYRINGE) IMPLANT
TOWEL OR 17X24 6PK STRL BLUE (TOWEL DISPOSABLE) ×2 IMPLANT
TRAY ENT MC OR (CUSTOM PROCEDURE TRAY) ×2 IMPLANT
TUBE CONNECTING 12X1/4 (SUCTIONS) ×2 IMPLANT
WATER STERILE IRR 1000ML POUR (IV SOLUTION) ×2 IMPLANT

## 2018-01-25 NOTE — Interval H&P Note (Signed)
History and Physical Interval Note:  01/25/2018 9:47 AM  Terry Harmon  has presented today for surgery, with the diagnosis of Deviated Septum,Tubinate Hypertrophy and Chronic Sinusitis  The various methods of treatment have been discussed with the patient and family. After consideration of risks, benefits and other options for treatment, the patient has consented to  Procedure(s): NASAL SEPTOPLASTY WITH TURBINATE REDUCTION (Bilateral) bilateral edoscopic ethmoid and Maxillary sinus surgery (Bilateral) as a surgical intervention .  The patient's history has been reviewed, patient examined, no change in status, stable for surgery.  I have reviewed the patient's chart and labs.  Questions were answered to the patient's satisfaction.     Serena ColonelJefry Cherrie Franca

## 2018-01-25 NOTE — Anesthesia Procedure Notes (Signed)
Procedure Name: Intubation Date/Time: 01/25/2018 10:31 AM Performed by: Marny Lowensteinapozzi, Terique Kawabata W, CRNA Pre-anesthesia Checklist: Patient identified, Emergency Drugs available, Suction available and Patient being monitored Patient Re-evaluated:Patient Re-evaluated prior to induction Oxygen Delivery Method: Circle system utilized Preoxygenation: Pre-oxygenation with 100% oxygen Induction Type: IV induction Ventilation: Mask ventilation without difficulty Laryngoscope Size: Miller and 2 Grade View: Grade I Tube type: Oral Tube size: 7.0 mm Number of attempts: 1 Airway Equipment and Method: Stylet Placement Confirmation: ETT inserted through vocal cords under direct vision,  positive ETCO2 and breath sounds checked- equal and bilateral Secured at: 20 cm Tube secured with: Tape Dental Injury: Teeth and Oropharynx as per pre-operative assessment

## 2018-01-25 NOTE — Op Note (Signed)
OPERATIVE REPORT  DATE OF SURGERY: 01/25/2018  PATIENT:  Terry Harmon,  [age] y.o. female  PRE-OPERATIVE DIAGNOSIS:  Deviated Septum,Tubinate Hypertrophy and Chronic Sinusitis  POST-OPERATIVE DIAGNOSIS:  Deviated Septum tubinate hypertrophy and chronic sinusitis  PROCEDURE:  Procedure(s): NASAL SEPTOPLASTY WITH TURBINATE REDUCTION bilateral edoscopic ethmoid and Maxillary sinus surgery  SURGEON:  Susy FrizzleJefry H Alva Kuenzel, MD  ASSISTANTS: none  ANESTHESIA:   General   EBL: 35 ml  DRAINS: none  LOCAL MEDICATIONS USED:  1% Xylocaine with epinephrine  SPECIMEN: Bilateral nasal and sinus contents  COUNTS:  Correct  PROCEDURE DETAILS: The patient was taken to the operating room and placed on the operating table in the supine position. Following induction of general endotracheal anesthesia, the nose was prepped and draped in a standard fashion. Afrin spray was used preoperatively in the holding area. 1% Xylocaine with epinephrine was infiltrated into the septum, the columella, and the inferior turbinates bilaterally.  The superior and posterior attachments of the middle turbinates were also infiltrated.  1. Nasal septoplasty. A left hemitransfixion incision was created with a 15 scalpel. A mucoperichondrial flap was developed posteriorly down the left side of the nasal septum using a Cottle elevator. This was performed all the way to the sphenoid rostrum. The bony cartilaginous junction was divided and a similar flap was developed down the right side. The ethmoid plate was one half angled superiorly to the right and inferiorly to the left and was mostly resected.  The maxillary crest was deflected towards the left side and was taken down with a 4 mm osteotome.  These maneuvers allowed the septum to lie in a nice midline position and relieved the majority of the obstruction.  2. Submucous resection inferior turbinates bilaterally. The leading edge of the inferior turbinates were incised in a vertical  fashion with a #15 scalpel. The Cottle elevator was used to elevate mucosa off bone in all directions. Fragments of the turbinate bone were resected with a Takahashi forceps. The turbinate remnants were outfractured with the Therapist, nutritionalreer elevator.  3.  Bilateral endoscopic ethmoidectomy.  The uncinate process was taken down on both sides using a sickle knife.  The microdebrider was then used to remove the uncinate open the bulla and perform a complete ethmoid sinus exoneration on both sides.  The majority of the ethmoid cells contained polypoid swelling and degeneration of the mucosa.  This continued into the posterior cells as well.  The grand lamella was taken down to expose the posterior cells.  The lateral limit of dissection was the lamina papyracea which was intact.  The fovea was the superior limit superiorly and was also kept intact.  The frontal recess was cleared of polypoid disease.  Nasal pore packing was placed bilaterally at the termination of the procedure.  4.  Bilateral endoscopic maxillary antrostomy.  Using a curved suction and 30 degree endoscope, the fontanelle was inspected and the antrum was entered and on both sides.  Posterior enlargement of the antrostomy was performed using through cup forceps and anterior enlargement using backbiting forceps.  All of these maneuvers greatly increased the nasal airways bilaterally. The nasal cavities were packed with rolled up Telfa coated with bacitracin ointment. The pharynx was suctioned of blood and secretions under direct visualization. The patient was awakened extubated and transferred to recovery in stable condition.    PATIENT DISPOSITION:  To PACU, stable

## 2018-01-25 NOTE — Transfer of Care (Signed)
Immediate Anesthesia Transfer of Care Note  Patient: Einar GipKirby N Kesinger  Procedure(s) Performed: NASAL SEPTOPLASTY WITH TURBINATE REDUCTION (Bilateral Nose) bilateral edoscopic ethmoid and Maxillary sinus surgery (Bilateral Nose)  Patient Location: PACU  Anesthesia Type:General  Level of Consciousness: awake and patient cooperative  Airway & Oxygen Therapy: Patient Spontanous Breathing and Patient connected to face mask oxygen  Post-op Assessment: Report given to RN and Post -op Vital signs reviewed and stable  Post vital signs: Reviewed and stable  Last Vitals:  Vitals Value Taken Time  BP 127/82 01/25/2018 11:57 AM  Temp    Pulse 97 01/25/2018 12:02 PM  Resp 24 01/25/2018 12:02 PM  SpO2 100 % 01/25/2018 12:02 PM  Vitals shown include unvalidated device data.  Last Pain:  Vitals:   01/25/18 0853  TempSrc:   PainSc: 0-No pain         Complications: No apparent anesthesia complications

## 2018-01-25 NOTE — Anesthesia Preprocedure Evaluation (Signed)
Anesthesia Evaluation  Patient identified by MRN, date of birth, ID band Patient awake    Reviewed: Allergy & Precautions, NPO status , Patient's Chart, lab work & pertinent test results  Airway Mallampati: II  TM Distance: >3 FB Neck ROM: Full    Dental  (+) Teeth Intact, Dental Advisory Given   Pulmonary    breath sounds clear to auscultation       Cardiovascular hypertension,  Rhythm:Regular Rate:Normal     Neuro/Psych    GI/Hepatic   Endo/Other    Renal/GU      Musculoskeletal   Abdominal   Peds  Hematology   Anesthesia Other Findings   Reproductive/Obstetrics                             Anesthesia Physical Anesthesia Plan  ASA: II  Anesthesia Plan: General   Post-op Pain Management:    Induction: Intravenous  PONV Risk Score and Plan: 1 and Ondansetron and Dexamethasone  Airway Management Planned: Oral ETT  Additional Equipment:   Intra-op Plan:   Post-operative Plan: Extubation in OR  Informed Consent: I have reviewed the patients History and Physical, chart, labs and discussed the procedure including the risks, benefits and alternatives for the proposed anesthesia with the patient or authorized representative who has indicated his/her understanding and acceptance.   Dental advisory given  Plan Discussed with: CRNA and Anesthesiologist  Anesthesia Plan Comments:         Anesthesia Quick Evaluation  

## 2018-01-25 NOTE — Anesthesia Postprocedure Evaluation (Signed)
Anesthesia Post Note  Patient: Terry Harmon  Procedure(s) Performed: NASAL SEPTOPLASTY WITH TURBINATE REDUCTION (Bilateral Nose) bilateral edoscopic ethmoid and Maxillary sinus surgery (Bilateral Nose)     Patient location during evaluation: PACU Anesthesia Type: General Level of consciousness: awake and alert Pain management: pain level controlled Vital Signs Assessment: post-procedure vital signs reviewed and stable Respiratory status: spontaneous breathing, nonlabored ventilation and respiratory function stable Cardiovascular status: blood pressure returned to baseline Anesthetic complications: no    Last Vitals:  Vitals:   01/25/18 1230 01/25/18 1317  BP:  128/74  Pulse:  80  Resp:  18  Temp: 36.6 C 36.6 C  SpO2:  100%    Last Pain:  Vitals:   01/25/18 1732  TempSrc:   PainSc: 6                  Barett Whidbee COKER

## 2018-01-26 ENCOUNTER — Encounter (HOSPITAL_COMMUNITY): Payer: Self-pay | Admitting: Otolaryngology

## 2018-01-26 DIAGNOSIS — Z79899 Other long term (current) drug therapy: Secondary | ICD-10-CM | POA: Diagnosis not present

## 2018-01-26 DIAGNOSIS — Z881 Allergy status to other antibiotic agents status: Secondary | ICD-10-CM | POA: Diagnosis not present

## 2018-01-26 DIAGNOSIS — J342 Deviated nasal septum: Secondary | ICD-10-CM | POA: Diagnosis not present

## 2018-01-26 DIAGNOSIS — J343 Hypertrophy of nasal turbinates: Secondary | ICD-10-CM | POA: Diagnosis not present

## 2018-01-26 DIAGNOSIS — J329 Chronic sinusitis, unspecified: Secondary | ICD-10-CM | POA: Diagnosis not present

## 2018-01-26 DIAGNOSIS — J309 Allergic rhinitis, unspecified: Secondary | ICD-10-CM | POA: Diagnosis not present

## 2018-01-26 DIAGNOSIS — J339 Nasal polyp, unspecified: Secondary | ICD-10-CM | POA: Diagnosis not present

## 2018-01-26 MED ORDER — PROMETHAZINE HCL 25 MG RE SUPP: 25.0000 mg | Freq: Four times a day (QID) | RECTAL | 1 refills | Status: DC | PRN

## 2018-01-26 MED ORDER — HYDROCODONE-ACETAMINOPHEN 7.5-325 MG PO TABS: 1.0000 | ORAL_TABLET | Freq: Four times a day (QID) | ORAL | 0 refills | Status: DC | PRN

## 2018-01-26 NOTE — Discharge Summary (Signed)
Physician Discharge Summary  Patient ID: Terry Harmon MRN: [id number] DOB/AGE: 09/26/1995 22 y.o.  Admit date: 01/25/2018 Discharge date: Einar Gip12/19/2019  Admission Diagnoses:chronic sinusitis  Discharge Diagnoses:  Active Problems:   Nasal obstruction   Discharged Condition: good  Hospital Course: no complications   Consults: none  Significant Diagnostic Studies: none  Treatments: surgery: end sinus surg, septoplasty  Discharge Exam: Blood pressure 119/72, pulse 83, temperature 98.3 F (36.8 C), temperature source Oral, resp. rate 17, height 5\' 9"  (1.753 m), weight 56.7 kg, last menstrual period 12/31/2017, SpO2 100 %. PHYSICAL EXAM: Packing removed, minimal bleeding, afrin applied.  Disposition: Discharge disposition: 01-Home or Self Care       Discharge Instructions    Diet - low sodium heart healthy   Complete by:  As directed    Increase activity slowly   Complete by:  As directed      Allergies as of 01/26/2018      Reactions   Doxycycline Nausea And Vomiting   Molds & Smuts Other (See Comments)   Sinus infections      Medication List    TAKE these medications   albuterol 108 (90 Base) MCG/ACT inhaler Commonly known as:  PROVENTIL HFA;VENTOLIN HFA Inhale 1-2 puffs into the lungs every 6 (six) hours as needed for wheezing or shortness of breath.   cetirizine 10 MG tablet Commonly known as:  ZYRTEC Take 10 mg by mouth daily.   EPIPEN 2-PAK 0.3 mg/0.3 mL Soaj injection Generic drug:  EPINEPHrine Inject 0.3 mg into the muscle once.   HYDROcodone-acetaminophen 7.5-325 MG tablet Commonly known as:  NORCO Take 1 tablet by mouth every 6 (six) hours as needed for moderate pain.   hydrocortisone 2.5 % cream Apply 1 application topically 2 (two) times daily as needed (for eczema).   promethazine 25 MG suppository Commonly known as:  PHENERGAN Place 1 suppository (25 mg total) rectally every 6 (six) hours as needed for nausea or vomiting.   WOMENS  MULTIVITAMIN PLUS PO Take 1 tablet by mouth daily.      Follow-up Information    Serena Colonelosen, Gwyndolyn Guilford, MD. Call on 01/30/2018.   Specialty:  Otolaryngology Contact information: [address] [place] [address] [phone]           Signed: Serena ColonelJefry Onaje Warne 01/26/2018, 8:44 AM

## 2018-01-26 NOTE — Discharge Instructions (Signed)
Avoid nose blowing for 2 weeks.  Avoid strenuous activity for 3 weeks.  Use saline nasal spray 10-20 times daily starting Friday.  Use afrin if needed for bleeding.

## 2018-01-26 NOTE — Progress Notes (Signed)
Patient discharged to home with instructions. 

## 2018-02-06 DIAGNOSIS — J301 Allergic rhinitis due to pollen: Secondary | ICD-10-CM | POA: Diagnosis not present

## 2018-02-06 DIAGNOSIS — L2089 Other atopic dermatitis: Secondary | ICD-10-CM | POA: Diagnosis not present

## 2018-02-06 DIAGNOSIS — J3089 Other allergic rhinitis: Secondary | ICD-10-CM | POA: Diagnosis not present

## 2018-02-06 DIAGNOSIS — H1045 Other chronic allergic conjunctivitis: Secondary | ICD-10-CM | POA: Diagnosis not present

## 2018-03-13 DIAGNOSIS — L309 Dermatitis, unspecified: Secondary | ICD-10-CM | POA: Diagnosis not present

## 2018-03-23 DIAGNOSIS — L309 Dermatitis, unspecified: Secondary | ICD-10-CM | POA: Diagnosis not present

## 2018-04-10 DIAGNOSIS — E282 Polycystic ovarian syndrome: Secondary | ICD-10-CM | POA: Diagnosis not present

## 2018-04-10 DIAGNOSIS — Q5039 Other congenital malformation of ovary: Secondary | ICD-10-CM | POA: Diagnosis not present

## 2018-04-10 DIAGNOSIS — R109 Unspecified abdominal pain: Secondary | ICD-10-CM | POA: Diagnosis not present

## 2018-04-10 DIAGNOSIS — R1032 Left lower quadrant pain: Secondary | ICD-10-CM | POA: Diagnosis not present

## 2018-04-10 DIAGNOSIS — Z8742 Personal history of other diseases of the female genital tract: Secondary | ICD-10-CM | POA: Diagnosis not present

## 2018-04-10 DIAGNOSIS — N838 Other noninflammatory disorders of ovary, fallopian tube and broad ligament: Secondary | ICD-10-CM | POA: Diagnosis not present

## 2018-04-18 DIAGNOSIS — L089 Local infection of the skin and subcutaneous tissue, unspecified: Secondary | ICD-10-CM | POA: Diagnosis not present

## 2018-04-18 DIAGNOSIS — L2089 Other atopic dermatitis: Secondary | ICD-10-CM | POA: Diagnosis not present

## 2018-07-17 ENCOUNTER — Other Ambulatory Visit: Payer: Self-pay

## 2018-07-17 ENCOUNTER — Ambulatory Visit: Payer: Self-pay

## 2018-07-17 ENCOUNTER — Encounter: Payer: Self-pay | Admitting: Family Medicine

## 2018-07-17 ENCOUNTER — Ambulatory Visit (INDEPENDENT_AMBULATORY_CARE_PROVIDER_SITE_OTHER): Payer: BC Managed Care – PPO | Admitting: Family Medicine

## 2018-07-17 DIAGNOSIS — M25512 Pain in left shoulder: Secondary | ICD-10-CM

## 2018-07-17 MED ORDER — ETODOLAC 400 MG PO TABS: 400.0000 mg | ORAL_TABLET | Freq: Two times a day (BID) | ORAL | 3 refills | Status: AC | PRN

## 2018-07-17 NOTE — Progress Notes (Signed)
Office Visit Note   Patient: Terry Harmon           Date of Birth: [birth year]           MRN: [id number] Visit Date: 07/17/2018 Requested by: Ronney AstersSummer, [name], MD [address] [place], [place] [id number] PCP: Ronney AstersSummer, [name], MD  Subjective: Chief Complaint  Patient presents with  . Left Shoulder - Pain    Pain since last week - was trying to learn to play golf. Feels like "something is not sitting correctly" in the shoulder - some areas feel loose/others tight.    HPI: She is a 23 year old right-hand-dominant female with left shoulder pain.  About 1 week ago she was taking golf lessons, attempted to swing hard with a driver and during the follow-up she felt something go wrong in her shoulder.  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  She is never had any troubles with her left shoulder.  Immediately after the event she had quite a bit of pain and she had to rest from golf for a couple days.  Now she is able to swing lightly but still feels like something is not quite right in her shoulder.  It feels a little bit out of place.  She has pain in the anterior and posterior aspect.  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              ROS: Denies fevers or chills or respiratory symptoms.  All other systems were reviewed and are negative.  Objective: Vital Signs: There were no vitals taken for this visit.  Physical Exam:  General:  Alert and oriented, in no acute distress. Pulm:  Breathing unlabored. Psy:  Normal mood, congruent affect. Skin: No rash or bruising. Left shoulder: She has symmetric range of motion of both shoulders with abduction of about 110 degrees, external rotation about 120 degrees and internal rotation about 90 degrees.  She has positive sulcus sign on both shoulders, and increased mobility with anterior and posterior translation.  Apprehension test is equivocal.  Isometric rotator cuff strength is 5/5  throughout with minimal pain.  Speeds test is negative, no palpable subluxation of the long head biceps tendon.  Imaging: X-rays left shoulder: No obvious congenital deformities.  Alignment is anatomic, there is a slight notch formation on the posterior humeral head but I do not think this represents a Hill-Sachs deformity.  Musculoskeletal ultrasound left shoulder: Long head biceps tendon is located in its groove with no fluid around it.  Subscapularis tendon has normal echotexture.  Supraspinatus has areas of slight swelling but no focal tears.  No significant subacromial/subdeltoid bursitis.  Infraspinatus tendon has normal appearance with no tears.  Posterior labrum is partially visualized and does not appear to have a tear.  The notch deformity of the posterior humeral head was visualized with ultrasound as well, no hyperemia on power Doppler imaging.   Assessment & Plan: 1.  Subacute left shoulder pain with possible subluxation/instability. -She is clinically improved so we will try conservative treatment with anti-inflammatory as needed, physical therapy at [facility].  If symptoms do not improve she will call me and we will order MRI arthrogram.     Procedures: No procedures performed  No notes on file     PMFS History: Patient Active Problem List   Diagnosis Date Noted  . Nasal obstruction 01/25/2018  . Deviated nasal septum 01/20/2018  . Nasal turbinate hypertrophy 01/20/2018  .  Pain of both hip joints 08/23/2017  . Chronic pansinusitis 06/20/2017  . Migraine without aura 04/18/2017  . Perennial allergic rhinitis 04/18/2017  . Abnormal blood sugar 11/29/2014  . Irregular menstrual cycle 02/26/2014  . Multiple environmental allergies 11/04/2013  . ECZEMA, ATOPIC DERMATITIS 04/07/2006   Past Medical History:  Diagnosis Date  . Anxiety    past panic attack  . Deviated septum   . Eczema   . Family history of adverse reaction to anesthesia    mother can not have  epidurals   . Hypotension   . 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 Mononucleosis    Age 79  . PCOS (polycystic ovarian syndrome)   . PONV (postoperative nausea and vomiting)   . Pre-diabetes    hypo  . Raynaud disease   . Seasonal allergies     Family History  Problem Relation Age of Onset  . Hypertension Mother   . Diabetes Maternal Grandmother   . Cancer Maternal Grandmother   . Diabetes Maternal Grandfather   . Hypertension Paternal Grandmother   . Hypertension Paternal Grandfather   . Autoimmune disease Maternal Aunt        Myastinia  . Autoimmune disease Sister     Past Surgical History:  Procedure Laterality Date  . ETHMOIDECTOMY Bilateral 01/25/2018   Procedure: bilateral edoscopic ethmoid and Maxillary sinus surgery;  Surgeon: Izora Gala, MD;  Location: [facility];  Service: ENT;  Laterality: Bilateral;  . NASAL SEPTOPLASTY W/ TURBINOPLASTY Bilateral 01/25/2018   Procedure: NASAL SEPTOPLASTY WITH TURBINATE REDUCTION;  Surgeon: Izora Gala, MD;  Location: [facility];  Service: ENT;  Laterality: Bilateral;  . NASAL SEPTUM SURGERY  01/2018  . WISDOM TOOTH EXTRACTION  2018   Social History   Occupational History  . Not on file  Tobacco Use  . Smoking status: Never Smoker  . Smokeless tobacco: Never Used  Substance and Sexual Activity  . Alcohol use: No  . Drug use: No  . Sexual activity: Not Currently

## 2018-08-21 DIAGNOSIS — E282 Polycystic ovarian syndrome: Secondary | ICD-10-CM | POA: Diagnosis not present

## 2018-08-21 DIAGNOSIS — E162 Hypoglycemia, unspecified: Secondary | ICD-10-CM | POA: Diagnosis not present

## 2018-08-21 DIAGNOSIS — N912 Amenorrhea, unspecified: Secondary | ICD-10-CM | POA: Diagnosis not present

## 2018-08-21 DIAGNOSIS — I959 Hypotension, unspecified: Secondary | ICD-10-CM | POA: Diagnosis not present

## 2018-09-01 DIAGNOSIS — B354 Tinea corporis: Secondary | ICD-10-CM | POA: Diagnosis not present

## 2018-09-01 DIAGNOSIS — L720 Epidermal cyst: Secondary | ICD-10-CM | POA: Diagnosis not present

## 2018-09-01 DIAGNOSIS — L239 Allergic contact dermatitis, unspecified cause: Secondary | ICD-10-CM | POA: Diagnosis not present

## 2018-12-29 DIAGNOSIS — Z20828 Contact with and (suspected) exposure to other viral communicable diseases: Secondary | ICD-10-CM | POA: Diagnosis not present

## 2018-12-29 DIAGNOSIS — Z03818 Encounter for observation for suspected exposure to other biological agents ruled out: Secondary | ICD-10-CM | POA: Diagnosis not present

## 2019-01-31 DIAGNOSIS — J301 Allergic rhinitis due to pollen: Secondary | ICD-10-CM | POA: Diagnosis not present

## 2019-01-31 DIAGNOSIS — J3089 Other allergic rhinitis: Secondary | ICD-10-CM | POA: Diagnosis not present

## 2019-01-31 DIAGNOSIS — L2089 Other atopic dermatitis: Secondary | ICD-10-CM | POA: Diagnosis not present

## 2019-01-31 DIAGNOSIS — H1045 Other chronic allergic conjunctivitis: Secondary | ICD-10-CM | POA: Diagnosis not present

## 2019-02-08 DIAGNOSIS — H8110 Benign paroxysmal vertigo, unspecified ear: Secondary | ICD-10-CM | POA: Diagnosis not present

## 2019-02-08 DIAGNOSIS — Z681 Body mass index (BMI) 19 or less, adult: Secondary | ICD-10-CM | POA: Diagnosis not present

## 2019-02-08 DIAGNOSIS — R519 Headache, unspecified: Secondary | ICD-10-CM | POA: Diagnosis not present

## 2019-02-12 DIAGNOSIS — M2480 Other specific joint derangements of unspecified joint, not elsewhere classified: Secondary | ICD-10-CM | POA: Diagnosis not present

## 2019-02-12 DIAGNOSIS — H8113 Benign paroxysmal vertigo, bilateral: Secondary | ICD-10-CM | POA: Diagnosis not present

## 2019-02-12 DIAGNOSIS — E282 Polycystic ovarian syndrome: Secondary | ICD-10-CM | POA: Diagnosis not present

## 2019-02-17 DIAGNOSIS — Z20828 Contact with and (suspected) exposure to other viral communicable diseases: Secondary | ICD-10-CM | POA: Diagnosis not present

## 2019-03-05 DIAGNOSIS — M5137 Other intervertebral disc degeneration, lumbosacral region: Secondary | ICD-10-CM | POA: Diagnosis not present

## 2019-03-05 DIAGNOSIS — M9901 Segmental and somatic dysfunction of cervical region: Secondary | ICD-10-CM | POA: Diagnosis not present

## 2019-03-05 DIAGNOSIS — H8112 Benign paroxysmal vertigo, left ear: Secondary | ICD-10-CM | POA: Diagnosis not present

## 2019-03-05 DIAGNOSIS — M9904 Segmental and somatic dysfunction of sacral region: Secondary | ICD-10-CM | POA: Diagnosis not present

## 2019-03-13 DIAGNOSIS — M9904 Segmental and somatic dysfunction of sacral region: Secondary | ICD-10-CM | POA: Diagnosis not present

## 2019-03-13 DIAGNOSIS — M5137 Other intervertebral disc degeneration, lumbosacral region: Secondary | ICD-10-CM | POA: Diagnosis not present

## 2019-03-13 DIAGNOSIS — M9901 Segmental and somatic dysfunction of cervical region: Secondary | ICD-10-CM | POA: Diagnosis not present

## 2019-03-13 DIAGNOSIS — H8112 Benign paroxysmal vertigo, left ear: Secondary | ICD-10-CM | POA: Diagnosis not present

## 2019-03-20 DIAGNOSIS — M9904 Segmental and somatic dysfunction of sacral region: Secondary | ICD-10-CM | POA: Diagnosis not present

## 2019-03-20 DIAGNOSIS — M9901 Segmental and somatic dysfunction of cervical region: Secondary | ICD-10-CM | POA: Diagnosis not present

## 2019-03-20 DIAGNOSIS — H8112 Benign paroxysmal vertigo, left ear: Secondary | ICD-10-CM | POA: Diagnosis not present

## 2019-03-20 DIAGNOSIS — M5137 Other intervertebral disc degeneration, lumbosacral region: Secondary | ICD-10-CM | POA: Diagnosis not present

## 2019-03-27 DIAGNOSIS — M5137 Other intervertebral disc degeneration, lumbosacral region: Secondary | ICD-10-CM | POA: Diagnosis not present

## 2019-03-27 DIAGNOSIS — M9901 Segmental and somatic dysfunction of cervical region: Secondary | ICD-10-CM | POA: Diagnosis not present

## 2019-03-27 DIAGNOSIS — H8112 Benign paroxysmal vertigo, left ear: Secondary | ICD-10-CM | POA: Diagnosis not present

## 2019-03-27 DIAGNOSIS — M9904 Segmental and somatic dysfunction of sacral region: Secondary | ICD-10-CM | POA: Diagnosis not present

## 2019-04-10 DIAGNOSIS — M9901 Segmental and somatic dysfunction of cervical region: Secondary | ICD-10-CM | POA: Diagnosis not present

## 2019-04-10 DIAGNOSIS — M9904 Segmental and somatic dysfunction of sacral region: Secondary | ICD-10-CM | POA: Diagnosis not present

## 2019-04-10 DIAGNOSIS — H8112 Benign paroxysmal vertigo, left ear: Secondary | ICD-10-CM | POA: Diagnosis not present

## 2019-04-10 DIAGNOSIS — M5137 Other intervertebral disc degeneration, lumbosacral region: Secondary | ICD-10-CM | POA: Diagnosis not present

## 2019-04-24 DIAGNOSIS — H8112 Benign paroxysmal vertigo, left ear: Secondary | ICD-10-CM | POA: Diagnosis not present

## 2019-04-24 DIAGNOSIS — M5137 Other intervertebral disc degeneration, lumbosacral region: Secondary | ICD-10-CM | POA: Diagnosis not present

## 2019-04-24 DIAGNOSIS — M9901 Segmental and somatic dysfunction of cervical region: Secondary | ICD-10-CM | POA: Diagnosis not present

## 2019-04-24 DIAGNOSIS — M9904 Segmental and somatic dysfunction of sacral region: Secondary | ICD-10-CM | POA: Diagnosis not present

## 2019-05-08 DIAGNOSIS — H8112 Benign paroxysmal vertigo, left ear: Secondary | ICD-10-CM | POA: Diagnosis not present

## 2019-05-08 DIAGNOSIS — M9901 Segmental and somatic dysfunction of cervical region: Secondary | ICD-10-CM | POA: Diagnosis not present

## 2019-05-08 DIAGNOSIS — M5137 Other intervertebral disc degeneration, lumbosacral region: Secondary | ICD-10-CM | POA: Diagnosis not present

## 2019-05-08 DIAGNOSIS — M9904 Segmental and somatic dysfunction of sacral region: Secondary | ICD-10-CM | POA: Diagnosis not present

## 2019-05-22 DIAGNOSIS — M9901 Segmental and somatic dysfunction of cervical region: Secondary | ICD-10-CM | POA: Diagnosis not present

## 2019-05-22 DIAGNOSIS — M9904 Segmental and somatic dysfunction of sacral region: Secondary | ICD-10-CM | POA: Diagnosis not present

## 2019-05-22 DIAGNOSIS — M5137 Other intervertebral disc degeneration, lumbosacral region: Secondary | ICD-10-CM | POA: Diagnosis not present

## 2019-05-22 DIAGNOSIS — H8112 Benign paroxysmal vertigo, left ear: Secondary | ICD-10-CM | POA: Diagnosis not present

## 2019-06-12 DIAGNOSIS — M5137 Other intervertebral disc degeneration, lumbosacral region: Secondary | ICD-10-CM | POA: Diagnosis not present

## 2019-06-12 DIAGNOSIS — M9904 Segmental and somatic dysfunction of sacral region: Secondary | ICD-10-CM | POA: Diagnosis not present

## 2019-06-12 DIAGNOSIS — H8112 Benign paroxysmal vertigo, left ear: Secondary | ICD-10-CM | POA: Diagnosis not present

## 2019-06-12 DIAGNOSIS — M9901 Segmental and somatic dysfunction of cervical region: Secondary | ICD-10-CM | POA: Diagnosis not present

## 2019-08-21 ENCOUNTER — Encounter: Payer: Self-pay | Admitting: Gastroenterology

## 2019-08-21 DIAGNOSIS — E162 Hypoglycemia, unspecified: Secondary | ICD-10-CM | POA: Diagnosis not present

## 2019-08-21 DIAGNOSIS — I959 Hypotension, unspecified: Secondary | ICD-10-CM | POA: Diagnosis not present

## 2019-08-21 DIAGNOSIS — E282 Polycystic ovarian syndrome: Secondary | ICD-10-CM | POA: Diagnosis not present

## 2019-09-05 ENCOUNTER — Encounter: Payer: Self-pay | Admitting: Gastroenterology

## 2019-09-20 ENCOUNTER — Ambulatory Visit: Payer: BC Managed Care – PPO | Admitting: Gastroenterology

## 2019-09-24 DIAGNOSIS — Z01419 Encounter for gynecological examination (general) (routine) without abnormal findings: Secondary | ICD-10-CM | POA: Diagnosis not present

## 2019-09-24 DIAGNOSIS — Z681 Body mass index (BMI) 19 or less, adult: Secondary | ICD-10-CM | POA: Diagnosis not present

## 2019-10-09 DIAGNOSIS — J0100 Acute maxillary sinusitis, unspecified: Secondary | ICD-10-CM | POA: Diagnosis not present

## 2019-10-24 DIAGNOSIS — M9904 Segmental and somatic dysfunction of sacral region: Secondary | ICD-10-CM | POA: Diagnosis not present

## 2019-10-24 DIAGNOSIS — M9901 Segmental and somatic dysfunction of cervical region: Secondary | ICD-10-CM | POA: Diagnosis not present

## 2019-10-24 DIAGNOSIS — M5442 Lumbago with sciatica, left side: Secondary | ICD-10-CM | POA: Diagnosis not present

## 2019-10-24 DIAGNOSIS — M542 Cervicalgia: Secondary | ICD-10-CM | POA: Diagnosis not present

## 2019-10-31 DIAGNOSIS — M542 Cervicalgia: Secondary | ICD-10-CM | POA: Diagnosis not present

## 2019-10-31 DIAGNOSIS — M9904 Segmental and somatic dysfunction of sacral region: Secondary | ICD-10-CM | POA: Diagnosis not present

## 2019-10-31 DIAGNOSIS — M9901 Segmental and somatic dysfunction of cervical region: Secondary | ICD-10-CM | POA: Diagnosis not present

## 2019-10-31 DIAGNOSIS — M5442 Lumbago with sciatica, left side: Secondary | ICD-10-CM | POA: Diagnosis not present

## 2019-11-05 DIAGNOSIS — Z30430 Encounter for insertion of intrauterine contraceptive device: Secondary | ICD-10-CM | POA: Diagnosis not present

## 2019-11-05 DIAGNOSIS — Z3202 Encounter for pregnancy test, result negative: Secondary | ICD-10-CM | POA: Diagnosis not present

## 2019-11-14 DIAGNOSIS — R1032 Left lower quadrant pain: Secondary | ICD-10-CM | POA: Diagnosis not present

## 2019-11-14 DIAGNOSIS — Z30430 Encounter for insertion of intrauterine contraceptive device: Secondary | ICD-10-CM | POA: Diagnosis not present

## 2019-11-14 DIAGNOSIS — M542 Cervicalgia: Secondary | ICD-10-CM | POA: Diagnosis not present

## 2019-11-14 DIAGNOSIS — N83202 Unspecified ovarian cyst, left side: Secondary | ICD-10-CM | POA: Diagnosis not present

## 2019-11-14 DIAGNOSIS — M9904 Segmental and somatic dysfunction of sacral region: Secondary | ICD-10-CM | POA: Diagnosis not present

## 2019-11-14 DIAGNOSIS — M9901 Segmental and somatic dysfunction of cervical region: Secondary | ICD-10-CM | POA: Diagnosis not present

## 2019-11-14 DIAGNOSIS — Z881 Allergy status to other antibiotic agents status: Secondary | ICD-10-CM | POA: Diagnosis not present

## 2019-11-14 DIAGNOSIS — M5442 Lumbago with sciatica, left side: Secondary | ICD-10-CM | POA: Diagnosis not present

## 2019-11-14 DIAGNOSIS — R102 Pelvic and perineal pain: Secondary | ICD-10-CM | POA: Diagnosis not present

## 2019-11-14 DIAGNOSIS — E282 Polycystic ovarian syndrome: Secondary | ICD-10-CM | POA: Diagnosis not present

## 2019-11-15 DIAGNOSIS — R102 Pelvic and perineal pain: Secondary | ICD-10-CM | POA: Diagnosis not present

## 2019-11-15 DIAGNOSIS — R1032 Left lower quadrant pain: Secondary | ICD-10-CM | POA: Diagnosis not present

## 2019-11-15 DIAGNOSIS — E282 Polycystic ovarian syndrome: Secondary | ICD-10-CM | POA: Diagnosis not present

## 2019-11-15 DIAGNOSIS — Z30430 Encounter for insertion of intrauterine contraceptive device: Secondary | ICD-10-CM | POA: Diagnosis not present

## 2019-11-21 DIAGNOSIS — M542 Cervicalgia: Secondary | ICD-10-CM | POA: Diagnosis not present

## 2019-11-21 DIAGNOSIS — M5442 Lumbago with sciatica, left side: Secondary | ICD-10-CM | POA: Diagnosis not present

## 2019-11-21 DIAGNOSIS — M9904 Segmental and somatic dysfunction of sacral region: Secondary | ICD-10-CM | POA: Diagnosis not present

## 2019-11-21 DIAGNOSIS — M9901 Segmental and somatic dysfunction of cervical region: Secondary | ICD-10-CM | POA: Diagnosis not present

## 2019-12-03 DIAGNOSIS — Z30431 Encounter for routine checking of intrauterine contraceptive device: Secondary | ICD-10-CM | POA: Diagnosis not present

## 2019-12-03 DIAGNOSIS — N83202 Unspecified ovarian cyst, left side: Secondary | ICD-10-CM | POA: Diagnosis not present

## 2019-12-03 DIAGNOSIS — Z1329 Encounter for screening for other suspected endocrine disorder: Secondary | ICD-10-CM | POA: Diagnosis not present

## 2019-12-03 DIAGNOSIS — N644 Mastodynia: Secondary | ICD-10-CM | POA: Diagnosis not present

## 2019-12-05 DIAGNOSIS — M542 Cervicalgia: Secondary | ICD-10-CM | POA: Diagnosis not present

## 2019-12-05 DIAGNOSIS — M5442 Lumbago with sciatica, left side: Secondary | ICD-10-CM | POA: Diagnosis not present

## 2019-12-05 DIAGNOSIS — M9904 Segmental and somatic dysfunction of sacral region: Secondary | ICD-10-CM | POA: Diagnosis not present

## 2019-12-05 DIAGNOSIS — M9901 Segmental and somatic dysfunction of cervical region: Secondary | ICD-10-CM | POA: Diagnosis not present

## 2019-12-19 DIAGNOSIS — M9901 Segmental and somatic dysfunction of cervical region: Secondary | ICD-10-CM | POA: Diagnosis not present

## 2019-12-19 DIAGNOSIS — M542 Cervicalgia: Secondary | ICD-10-CM | POA: Diagnosis not present

## 2019-12-19 DIAGNOSIS — M9904 Segmental and somatic dysfunction of sacral region: Secondary | ICD-10-CM | POA: Diagnosis not present

## 2019-12-19 DIAGNOSIS — M5442 Lumbago with sciatica, left side: Secondary | ICD-10-CM | POA: Diagnosis not present

## 2019-12-31 ENCOUNTER — Ambulatory Visit: Payer: Self-pay | Admitting: Physician Assistant

## 2020-01-02 DIAGNOSIS — N644 Mastodynia: Secondary | ICD-10-CM | POA: Diagnosis not present

## 2020-01-02 DIAGNOSIS — N83202 Unspecified ovarian cyst, left side: Secondary | ICD-10-CM | POA: Diagnosis not present

## 2020-01-16 DIAGNOSIS — M9901 Segmental and somatic dysfunction of cervical region: Secondary | ICD-10-CM | POA: Diagnosis not present

## 2020-01-16 DIAGNOSIS — M9904 Segmental and somatic dysfunction of sacral region: Secondary | ICD-10-CM | POA: Diagnosis not present

## 2020-01-16 DIAGNOSIS — M5442 Lumbago with sciatica, left side: Secondary | ICD-10-CM | POA: Diagnosis not present

## 2020-01-16 DIAGNOSIS — M542 Cervicalgia: Secondary | ICD-10-CM | POA: Diagnosis not present

## 2020-01-29 ENCOUNTER — Encounter: Payer: Self-pay | Admitting: Physician Assistant

## 2020-01-29 ENCOUNTER — Ambulatory Visit (INDEPENDENT_AMBULATORY_CARE_PROVIDER_SITE_OTHER): Payer: BC Managed Care – PPO | Admitting: Physician Assistant

## 2020-01-29 ENCOUNTER — Other Ambulatory Visit (INDEPENDENT_AMBULATORY_CARE_PROVIDER_SITE_OTHER): Payer: BC Managed Care – PPO

## 2020-01-29 VITALS — BP 98/60 | HR 112 | Ht 68.5 in | Wt 130.2 lb

## 2020-01-29 DIAGNOSIS — R112 Nausea with vomiting, unspecified: Secondary | ICD-10-CM | POA: Diagnosis not present

## 2020-01-29 LAB — CBC WITH DIFFERENTIAL/PLATELET
Basophils Absolute: 0 10*3/uL (ref 0.0–0.1)
Basophils Relative: 0.7 % (ref 0.0–3.0)
Eosinophils Absolute: 0.1 10*3/uL (ref 0.0–0.7)
Eosinophils Relative: 2.2 % (ref 0.0–5.0)
HCT: 43.5 % (ref 36.0–46.0)
Hemoglobin: 14.8 g/dL (ref 12.0–15.0)
Lymphocytes Relative: 23.1 % (ref 12.0–46.0)
Lymphs Abs: 1.5 10*3/uL (ref 0.7–4.0)
MCHC: 33.9 g/dL (ref 30.0–36.0)
MCV: 90.5 fl (ref 78.0–100.0)
Monocytes Absolute: 0.4 10*3/uL (ref 0.1–1.0)
Monocytes Relative: 6.3 % (ref 3.0–12.0)
Neutro Abs: 4.3 10*3/uL (ref 1.4–7.7)
Neutrophils Relative %: 67.7 % (ref 43.0–77.0)
Platelets: 223 10*3/uL (ref 150.0–400.0)
RBC: 4.81 Mil/uL (ref 3.87–5.11)
RDW: 12.7 % (ref 11.5–15.5)
WBC: 6.4 10*3/uL (ref 4.0–10.5)

## 2020-01-29 LAB — C-REACTIVE PROTEIN: CRP: <1 mg/dL (ref 0.5–20.0)

## 2020-01-29 LAB — SEDIMENTATION RATE: Sed Rate: 1 mm/hr (ref 0–20)

## 2020-01-29 MED ORDER — GLYCOPYRROLATE 2 MG PO TABS: 2.0000 mg | ORAL_TABLET | Freq: Every morning | ORAL | 4 refills | Status: AC

## 2020-01-29 NOTE — Patient Instructions (Signed)
If you are age 22 or older, your body mass index should be between 23-30. Your Body mass index is 19.52 kg/m. If this is out of the aforementioned range listed, please consider follow up with your Primary Care Provider.  If you are age 26 or younger, your body mass index should be between 19-25. Your Body mass index is 19.52 kg/m. If this is out of the aformentioned range listed, please consider follow up with your Primary Care Provider.   You have been scheduled for an abdominal ultrasound at [facility] (1st floor of hospital) on 02/04/20 at 8:00 am. Please arrive 15 minutes prior to your appointment for registration. Make certain not to have anything to eat or drink 6 hours prior to your appointment. Should you need to reschedule your appointment, please contact radiology at [phone]. This test typically takes about 30 minutes to perform.  Your provider has requested that you go to the basement level for lab work before leaving today. Press "B" on the elevator. The lab is located at the first door on the left as you exit the elevator.   START glycopyrrolate 2 mg 1 tablet every morning for IBS/Dyspepsia.  Follow up pending your labs and [facility].  Thank you for entrusting me with your care and choosing [facility].  Amy Esterwood, PA-C

## 2020-01-29 NOTE — Progress Notes (Signed)
Subjective:    Patient ID: Terry Harmon, female    DOB: 07/13/1995, 24 y.o.   MRN: [id number]  HPI Terry Harmon is a pleasant 24 year old white female, established with Dr. Leone Payor.  She was last seen in our office in 2019 by Hyacinth Meeker, PA-C with diagnosis of IBS. Patient says she had made this appointment after she had been told by a biofeedback therapist that she may have abdominal migraines. Patient says she has frequent episodes of vomiting postprandially.  She says that she holds all of the stress in her body and her abdomen and feels that stress tends to trigger her episodes of nausea and vomiting.  She says she had a period of time this summer when she was not particularly stressed that she was still waking up early in the mornings with some nausea, would vomit once and then feel fine for the rest of the day.  She says she does not feel that she has an eating disorder and is not self inducing any vomiting.  She does not vomit on a daily basis.  She says that she had increase in symptoms over the past few weeks while in school, and going through finals etc.  Occasionally she will eat and then within 30 minutes of eating may have an episode of vomiting.  She denies any ongoing abdominal pain though relates some "tension" sensation in her upper abdomen.  No heartburn or indigestion no dysphagia or odynophagia, no problems with headaches.  Feels fine in between these episodes.  When stressed she may have an episode daily for about a week at a time.  Overall she has had symptoms intermittently over the past 3 years. She relates that she did have some imaging done while she was in [place] at [facility] this fall. Review of records in care everywhere show CT abdomen of and pelvis done in October 2021 which questioned gallbladder sludge, showed stool throughout the colon, IUD in place and possible small involuting follicle or hemorrhagic cyst in the left adnexa.  Patient has history of  PCOS.  Review of Systems Pertinent positive and negative review of systems were noted in the above HPI section.  All other review of systems was otherwise negative.  Outpatient Encounter Medications as of 01/29/2020  Medication Sig  . albuterol (PROVENTIL HFA;VENTOLIN HFA) 108 (90 Base) MCG/ACT inhaler Inhale 1-2 puffs into the lungs every 6 (six) hours as needed for wheezing or shortness of breath.  . cetirizine (ZYRTEC) 10 MG tablet Take 10 mg by mouth daily.  Marland Kitchen etodolac (LODINE) 400 MG tablet Take 1 tablet (400 mg total) by mouth 2 (two) times daily as needed.  . fexofenadine (ALLEGRA) 180 MG tablet Take 1 tablet by mouth as needed.  . fluticasone (FLONASE) 50 MCG/ACT nasal spray Place 1 spray into both nostrils daily.  . hydrocortisone 2.5 % cream Apply 1 application topically 2 (two) times daily as needed (for eczema).  Marland Kitchen levonorgestrel (KYLEENA) 19.5 MG IUD Kyleena 17.5 mcg/24 hrs (62yrs) 19.5mg  intrauterine device  Take 19.5 mg by intrauterine route.  . Multiple Vitamins-Minerals (WOMENS MULTIVITAMIN PLUS PO) Take 1 tablet by mouth daily.  . Omega-3 Fatty Acids (FISH OIL PO) Take 1 capsule by mouth daily.  Marland Kitchen triamcinolone ointment (KENALOG) 0.1 % Apply 1 application topically as needed.  Marland Kitchen EPINEPHrine 0.3 mg/0.3 mL IJ SOAJ injection Inject 0.3 mg into the muscle once. (Patient not taking: Reported on 01/29/2020)  . glycopyrrolate (ROBINUL) 2 MG tablet Take 1 tablet (2 mg  total) by mouth in the morning.   No facility-administered encounter medications on file as of 01/29/2020.   Allergies  Allergen Reactions  . Doxycycline Nausea And Vomiting  . Molds & Smuts Other (See Comments)    Sinus infections   Patient Active Problem List   Diagnosis Date Noted  . Nasal obstruction 01/25/2018  . Deviated nasal septum 01/20/2018  . Nasal turbinate hypertrophy 01/20/2018  . Pain of both hip joints 08/23/2017  . Chronic pansinusitis 06/20/2017  . Migraine without aura 04/18/2017  .  Perennial allergic rhinitis 04/18/2017  . Abnormal blood sugar 11/29/2014  . Irregular menstrual cycle 02/26/2014  . Multiple environmental allergies 11/04/2013  . ECZEMA, ATOPIC DERMATITIS 04/07/2006   Social History   Socioeconomic History  . Marital status: Single    Spouse name: Not on file  . Number of children: Not on file  . Years of education: Not on file  . Highest education level: Not on file  Occupational History  . Not on file  Tobacco Use  . Smoking status: Never Smoker  . Smokeless tobacco: Never Used  Vaping Use  . Vaping Use: Never used  Substance and Sexual Activity  . Alcohol use: No  . Drug use: No  . Sexual activity: Not Currently  Other Topics Concern  . Not on file  Social History Narrative   Lives at home with mom dad and three siblings, attended [employer]    [employer] business major   Social Determinants of Health   Financial Resource Strain: Not on file  Food Insecurity: Not on file  Transportation Needs: Not on file  Physical Activity: Not on file  Stress: Not on file  Social Connections: Not on file  Intimate Partner Violence: Not on file    Ms. Steury's family history includes Autoimmune disease in her maternal aunt and sister; Cancer in her maternal grandmother; Diabetes in her maternal grandfather and maternal grandmother; Hypertension in her mother, paternal grandfather, and paternal grandmother.      Objective:    Vitals:   01/29/20 0906  BP: 98/60  Pulse: (!) 112    Physical Exam Well-developed well-nourished young white female in no acute distress.  Height, Weight, 130 BMI 19.5  HEENT; nontraumatic normocephalic, EOMI, PE R LA, sclera anicteric. Oropharynx; not examined Neck; supple, no JVD Cardiovascular; regular rate and rhythm with S1-S2, no murmur rub or gallop Pulmonary; Clear bilaterally Abdomen; soft, nontender, nondistended, no palpable mass or hepatosplenomegaly, bowel sounds are active Rectal; not done  today Skin; benign exam, no jaundice rash or appreciable lesions Extremities; no clubbing cyanosis or edema skin warm and dry Neuro/Psych; alert and oriented x4, grossly nonfocal mood and affect appropriate      Assessment & Plan:    #9  24 year old white female with 3-year history of intermittent episodes of brief nausea followed by vomiting.  Most of these episodes occur during periods of stress but have on occasion occurred when she has not been particularly aware of being stressed. No complaints of ongoing abdominal pain, weight has been stable, no GERD symptoms.  Feels fine in between episodes. I think the episodes of nausea and vomiting are functional and secondary to underlying anxiety, functional dyspepsia. She does not really meet criteria for cyclic vomiting as symptoms do not become intractable. No cannabis use.  #2 history of PCOS  Plan' Reassurance-, discussed that symptoms are fairly commonly seen in patients with underlying ongoing anxiety. Will check upper abdominal ultrasound CBC, sed rate, CRP TTG and  IgA. Discussed potential trial of SSRI, she would like to avoid Trial of glycopyrrolate 2 mg p.o. every morning. Any further recommendations pending results of labs and ultrasound.  Tameisha Covell Oswald Hillock PA-C 01/29/2020   Cc: Ronney Asters, MD

## 2020-01-30 LAB — TISSUE TRANSGLUTAMINASE, IGA: (tTG) Ab, IgA: <1 U/mL

## 2020-01-30 LAB — IGA: Immunoglobulin A: 201 mg/dL (ref 47–310)

## 2020-01-31 ENCOUNTER — Telehealth: Payer: Self-pay | Admitting: Physician Assistant

## 2020-01-31 DIAGNOSIS — I7300 Raynaud's syndrome without gangrene: Secondary | ICD-10-CM | POA: Diagnosis not present

## 2020-01-31 DIAGNOSIS — E282 Polycystic ovarian syndrome: Secondary | ICD-10-CM | POA: Diagnosis not present

## 2020-01-31 NOTE — Telephone Encounter (Signed)
Patient given the phone number to radiology scheduling.

## 2020-01-31 NOTE — Telephone Encounter (Signed)
Patient calling to reschedule [place] appt for Monday

## 2020-02-04 ENCOUNTER — Ambulatory Visit (HOSPITAL_COMMUNITY): Payer: BC Managed Care – PPO

## 2020-02-06 ENCOUNTER — Other Ambulatory Visit: Payer: Self-pay

## 2020-02-06 ENCOUNTER — Ambulatory Visit (INDEPENDENT_AMBULATORY_CARE_PROVIDER_SITE_OTHER): Payer: BC Managed Care – PPO

## 2020-02-06 DIAGNOSIS — K8020 Calculus of gallbladder without cholecystitis without obstruction: Secondary | ICD-10-CM | POA: Diagnosis not present

## 2020-02-07 ENCOUNTER — Other Ambulatory Visit: Payer: BC Managed Care – PPO

## 2021-06-09 IMAGING — US US ABDOMEN COMPLETE
1 series · 14 of 25 positions shown · non-contrast
Comparison: None.

CLINICAL DATA: Intermittent nausea and vomiting

EXAM:
ABDOMEN ULTRASOUND COMPLETE

[Series 1: us abdomen complete · 0.09mm/px · 14 of 138 slices shown]
[im 1/138]
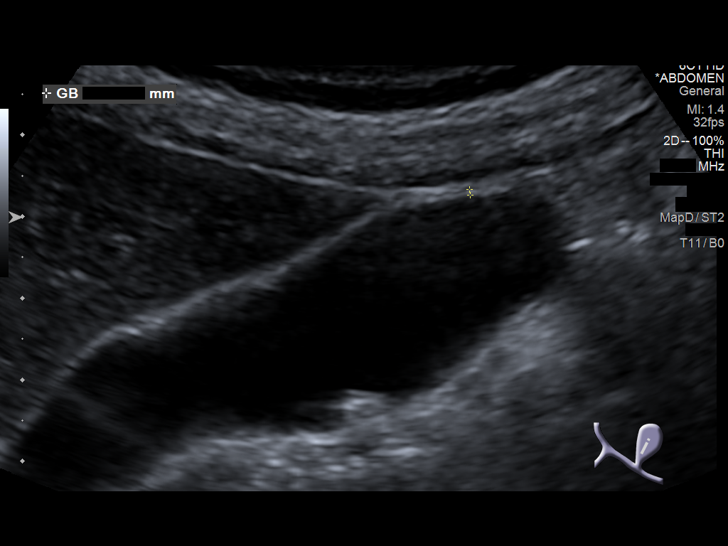
[im 12/138]
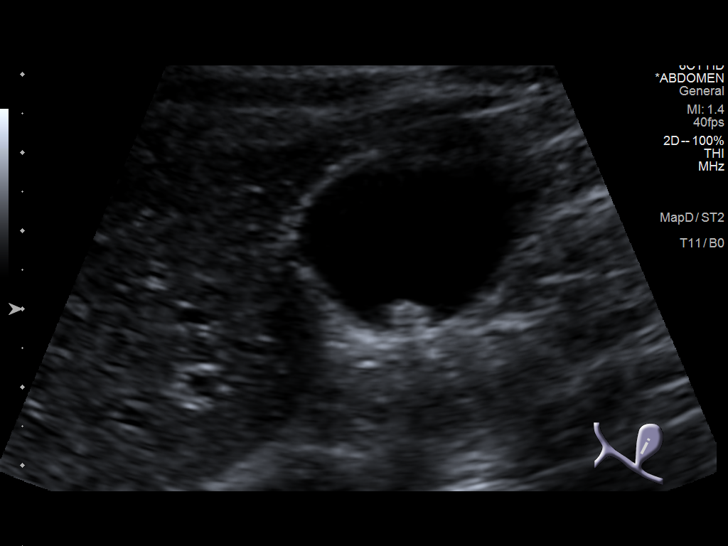
[im 23/138]
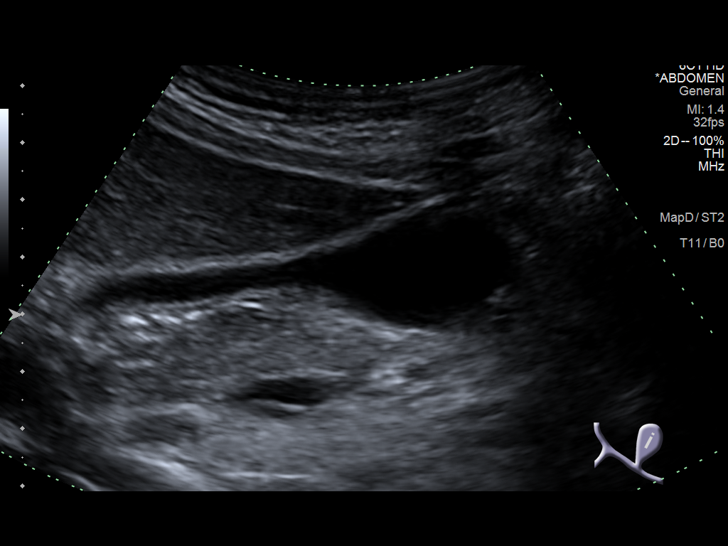
[im 35/138]
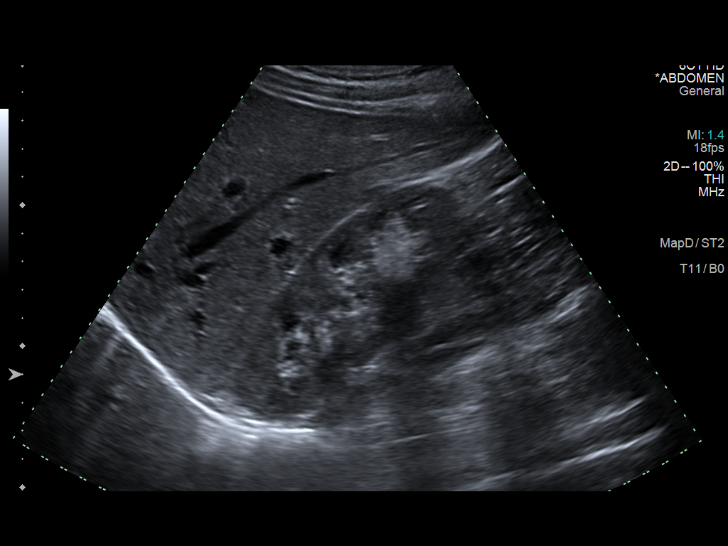
[im 46/138]
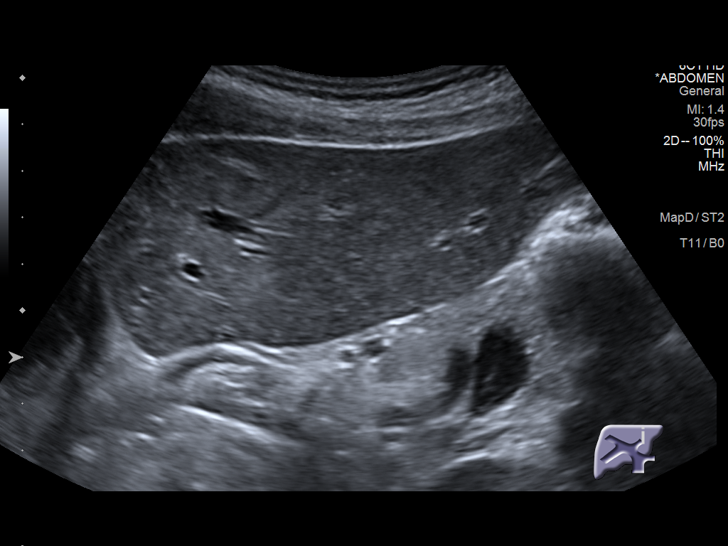
[im 52/138]
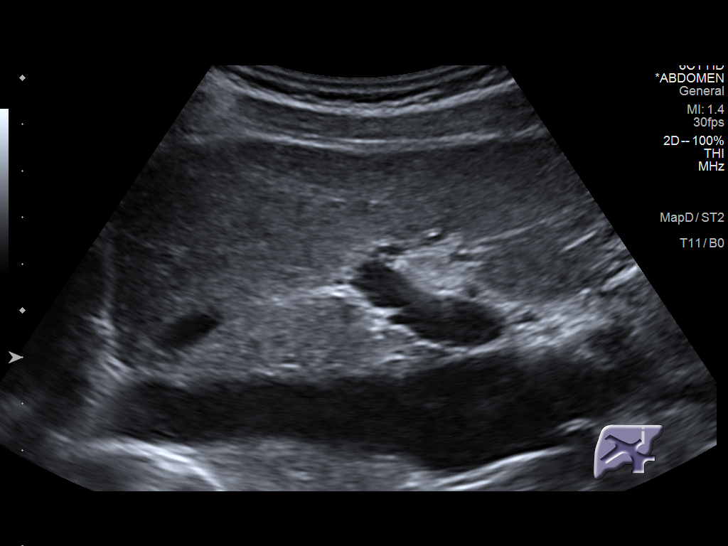
[im 63/138]
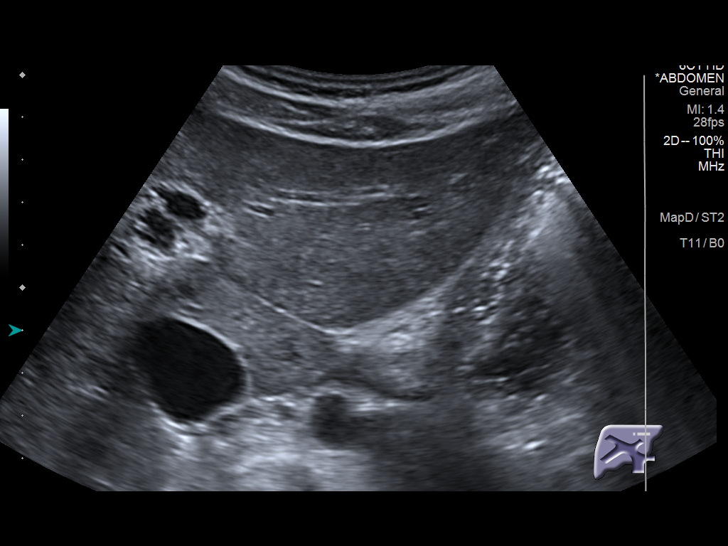
[im 75/138]
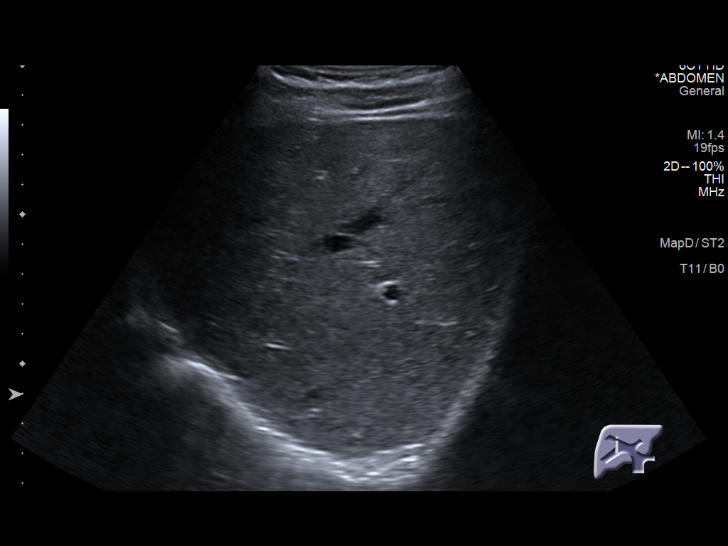
[im 86/138]
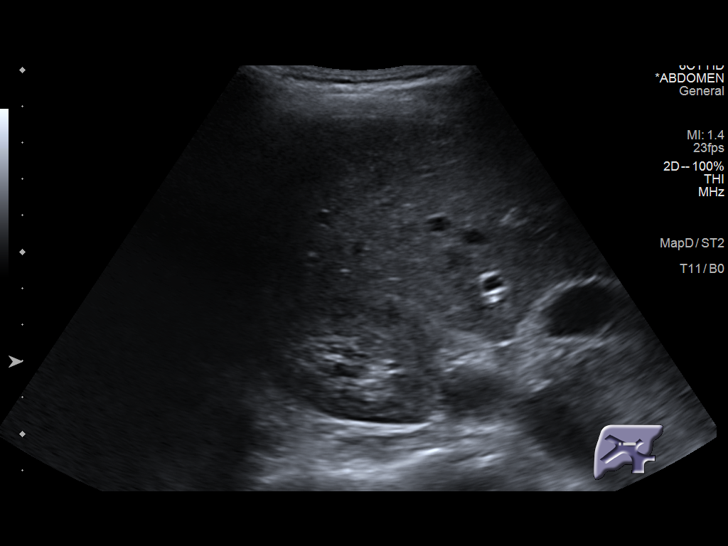
[im 92/138]
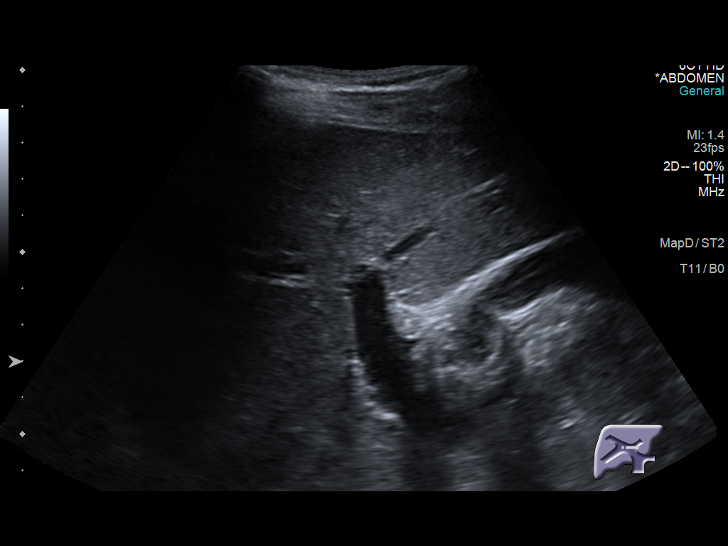
[im 103/138]
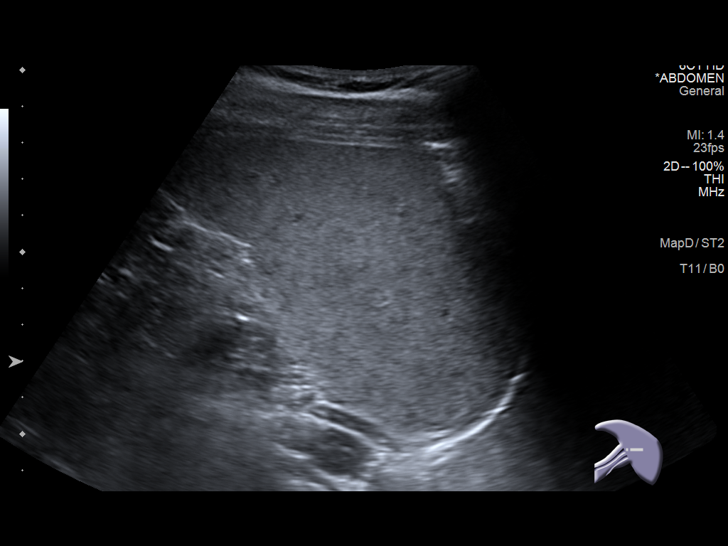
[im 115/138]
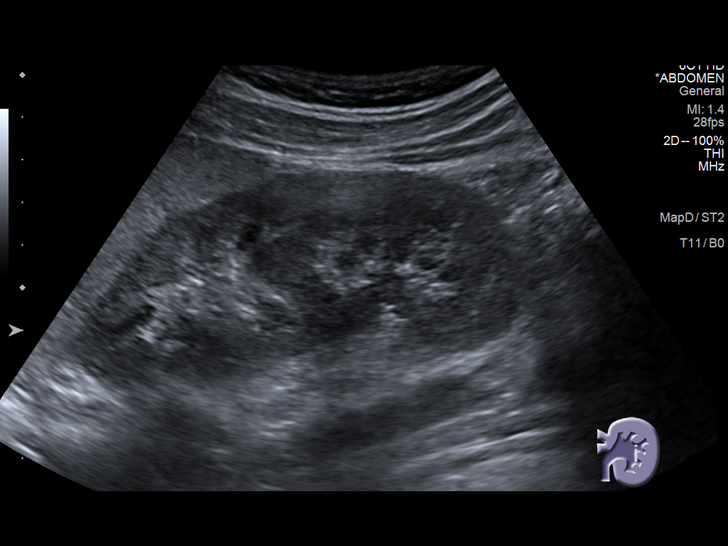
[im 126/138]
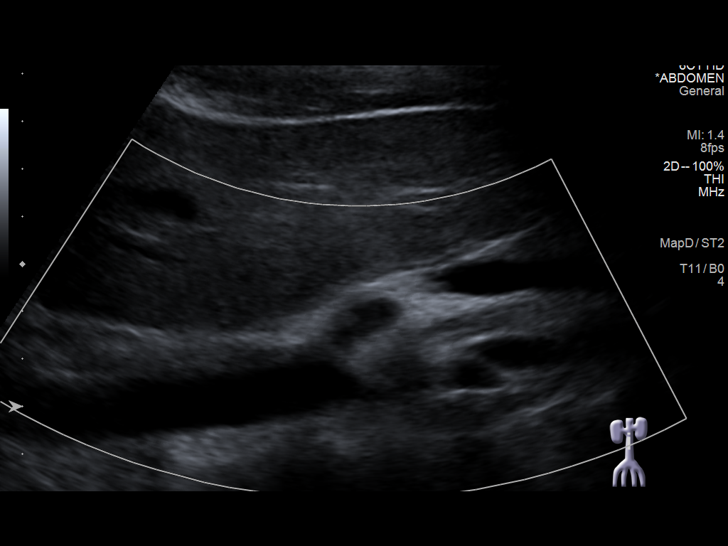
[im 138/138]
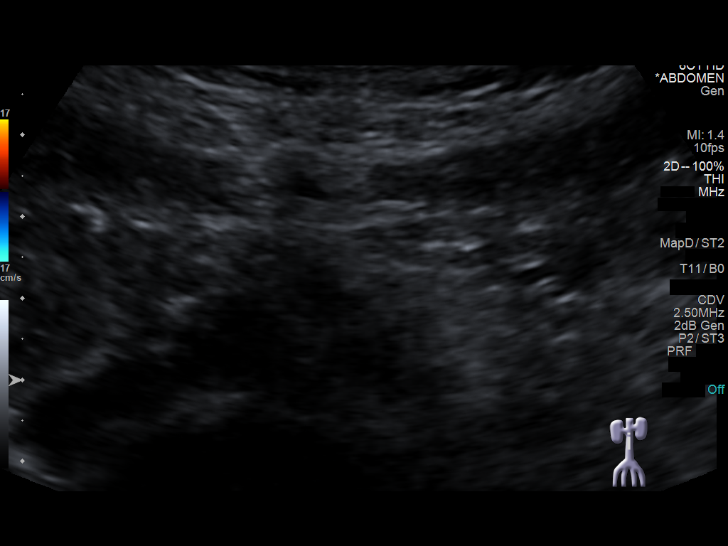

[14 of 25 positions shown; findings below may reference images not displayed]

FINDINGS: Gallbladder: Small echogenic shadowing gallstones noted measuring up
to 7 mm. No Murphy's sign or pericholecystic fluid. No signs of
cholecystitis. Wall thickness measures 1 mm.

Common bile duct: Diameter: 4 mm

Liver: No focal lesion identified. Within normal limits in
parenchymal echogenicity. Portal vein is patent on color Doppler
imaging with normal direction of blood flow towards the liver.

IVC: No abnormality visualized.

Pancreas: Visualized portion unremarkable.

Spleen: Size and appearance within normal limits.

Right Kidney: Length: 11.5 cm. Echogenicity within normal limits. No
mass or hydronephrosis visualized.

Left Kidney: Length: 10.8 cm. Echogenicity within normal limits. No
mass or hydronephrosis visualized.

Abdominal aorta: No aneurysm visualized.

Other findings: No free fluid or ascites
IMPRESSION: Cholelithiasis without signs of cholecystitis.

No other acute finding by abdominal ultrasound.
